# Patient Record
Sex: Male | Born: 1980
Health system: Southern US, Community
[De-identification: ages and names within clinical notes are randomized; demographics above are authoritative.]

## PROBLEM LIST (undated history)

## (undated) DIAGNOSIS — M1990 Unspecified osteoarthritis, unspecified site: Secondary | ICD-10-CM

## (undated) HISTORY — PX: LASIK: SHX215

## (undated) HISTORY — PX: WISDOM TOOTH EXTRACTION: SHX21

## (undated) HISTORY — PX: KNEE SURGERY: SHX244

---

## 2015-03-30 ENCOUNTER — Other Ambulatory Visit: Payer: Self-pay | Admitting: Physician Assistant

## 2015-03-30 NOTE — H&P (Signed)
Cesar Cobb is a new patient to the office.  He presents for evaluation and treatment recommendations for his right knee. I do not have old records but he is a very good historian. His trauma dates back to an injury almost sixteen years ago. He had and arthroscopy and microfracturing by Dr. Latanya Maudlin in [place]. He has had a picture of progressive degenerative arthritis throughout the entire knee over a number of years. This has gotten the best of him within the last year. He is starting to get rest pain and night pain. Increasing deformity.  Marked retropatellar crepitus. There are some mild symptoms on the left but the right has really reached a point he wants to pursue definitive treatment. He went through a series of cortisone injections as well as viscosupplementation. He did get relief for a number of months but nothing long lasting. He saw  Dr. Christain Sacramento in [place] who talked about a unicompartmental replacement in the patellofemoral joint. An MRI was obtained that showed diffuse changes. He was essentially told the only option was a knee replacement. Because of his young age this has been put off as long as possible but he has reached a point of exhausting conservative treatment.  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   Of note, there is not a dramatic history of this much arthritis at a young age in his family.   Past Medical, Family and Social History are reviewed in detail on patient questionnaire and signed. Review of Systems as detailed in HPI; all others reviewed and are negative.     EXAMINATION: Height:     Weight: 175 pounds.   Blood pressure:  145/92.  Pulse:  71  Well-developed, well-nourished 34 year old male in no acute distress.  Alert and oriented x 3.  Examination shows an antalgic gait bilaterally, much worse on the right where he is significantly limping. He has difficulty with any impact loading. There is a negative straight leg raise both sides. Negative log roll both hips. The  right knee has motion of 5-120. Marked patellofemoral crepitus. Stable ligaments. On the left has some moderate patellofemoral crepitus. Motion 0-140. Stable ligaments. He is neurovascularly intact distally.   X-RAYS: Four view standing X-rays show marked changes on the right. Bone on bone patellofemoral joint with tricompartmental changes as well. On the left he has moderate diffuse changes. Nothing is extreme.     DISPOSITION: Endstage arthritis right knee. Primary, generalized. History of trauma in the past. A picture of just progressive tricompartmental changes. He has exhausted conservative therapy. The only significant long term option he has is a total knee replacement on the right. He thoroughly understands that.   I reviewed the procedure, risks, benefits and complications. I anticipate a recovery rehab and final outcome also outlined. Based on his job, he would like to put this off until November. In the interim we will try and repeat one more cortisone injection to give him some relief. We will see him just short of operative intervention.   PROCEDURE: The patient's clinical condition is marked by substantial pain and/or significant functional disability. Other conservative therapy has not provided relief, is contraindicated, or not appropriate. There is a reasonable likelihood that injection will significantly improve the patient's pain and/or functional disability.   After appropriate consent and under sterile technique an intra-articular injection of the right knee 2:6 Depo-Medrol/Marcaine was accomplished atraumatically.  Patient tolerated the procedure well.      Loreta Ave, M.D.

## 2015-04-11 NOTE — Pre-Procedure Instructions (Signed)
Cesar Cobb  04/11/2015      [facility] - [place] - [address] [place] [address] Phone: [phone] Fax: [phone]    Your procedure is scheduled on Wednesday, April 25, 2015  [facility] Admitting at 6:30 A.M.  Call this number if you have problems the morning of surgery:  [phone]   Remember:  Do not eat food or drink liquids after midnight Tuesday, April 24, 2015  Take these medicines the morning of surgery with A SIP OF WATER : None  Stop taking Aspirin, vitamins and herbal medications. Do not take any NSAIDs ie: Ibuprofen, Advil, Naproxen or any medication containing Aspirin; stop Wednesday, April 18, 2015   Do not wear jewelry, make-up or nail polish.  Do not wear lotions, powders, or perfumes.  You may not wear deodorant.  Do not shave 48 hours prior to surgery.  Men may shave face and neck.  Do not bring valuables to the hospital.  [facility] is not responsible for any belongings or valuables.  Contacts, dentures or bridgework may not be worn into surgery.  Leave your suitcase in the car.  After surgery it may be brought to your room.  For patients admitted to the hospital, discharge time will be determined by your treatment team.  Patients discharged the day of surgery will not be allowed to drive home.   Name and phone number of your driver:    Special instructions:  Special Instructions:Special Instructions: [facility] - Preparing for Surgery  Before surgery, you can play an important role.  Because skin is not sterile, your skin needs to be as free of germs as possible.  You can reduce the number of germs on you skin by washing with CHG (chlorahexidine gluconate) soap before surgery.  CHG is an antiseptic cleaner which kills germs and bonds with the skin to continue killing germs even after washing.  Please DO NOT use if you have an allergy to CHG or antibacterial  soaps.  If your skin becomes reddened/irritated stop using the CHG and inform your nurse when you arrive at Short Stay.  Do not shave (including legs and underarms) for at least 48 hours prior to the first CHG shower.  You may shave your face.  Please follow these instructions carefully:   1.  Shower with CHG Soap the night before surgery and the morning of Surgery.  2.  If you choose to wash your hair, wash your hair first as usual with your normal shampoo.  3.  After you shampoo, rinse your hair and body thoroughly to remove the Shampoo.  4.  Use CHG as you would any other liquid soap.  You can apply chg directly  to the skin and wash gently with scrungie or a clean washcloth.  5.  Apply the CHG Soap to your body ONLY FROM THE NECK DOWN.  Do not use on open wounds or open sores.  Avoid contact with your eyes, ears, mouth and genitals (private parts).  Wash genitals (private parts) with your normal soap.  6.  Wash thoroughly, paying special attention to the area where your surgery will be performed.  7.  Thoroughly rinse your body with warm water from the neck down.  8.  DO NOT shower/wash with your normal soap after using and rinsing off the CHG Soap.  9.  Pat yourself dry with a clean towel.  10.  Wear clean pajamas.            11.  Place clean sheets on your bed the night of your first shower and do not sleep with pets.  Day of Surgery  Do not apply any lotions/deodorants the morning of surgery.  Please wear clean clothes to the hospital/surgery center.  Please read over the following fact sheets that you were given. Pain Booklet, Coughing and Deep Breathing, Blood Transfusion Information, Total Joint Packet, MRSA Information and Surgical Site Infection Prevention

## 2015-04-12 ENCOUNTER — Encounter (HOSPITAL_COMMUNITY): Payer: Self-pay

## 2015-04-12 ENCOUNTER — Encounter (HOSPITAL_COMMUNITY)
Admission: RE | Admit: 2015-04-12 | Discharge: 2015-04-12 | Disposition: A | Discharge status: Home / Self Care | Payer: BLUE CROSS/BLUE SHIELD | Source: Ambulatory Visit | Attending: Orthopedic Surgery | Admitting: Orthopedic Surgery

## 2015-04-12 DIAGNOSIS — Z0183 Encounter for blood typing: Secondary | ICD-10-CM | POA: Diagnosis not present

## 2015-04-12 DIAGNOSIS — Z01812 Encounter for preprocedural laboratory examination: Secondary | ICD-10-CM | POA: Diagnosis not present

## 2015-04-12 DIAGNOSIS — M179 Osteoarthritis of knee, unspecified: Secondary | ICD-10-CM | POA: Insufficient documentation

## 2015-04-12 DIAGNOSIS — Z01818 Encounter for other preprocedural examination: Secondary | ICD-10-CM | POA: Insufficient documentation

## 2015-04-12 DIAGNOSIS — R001 Bradycardia, unspecified: Secondary | ICD-10-CM | POA: Diagnosis not present

## 2015-04-12 HISTORY — DX: Unspecified osteoarthritis, unspecified site: M19.90

## 2015-04-12 LAB — CBC WITH DIFFERENTIAL/PLATELET
BASOS ABS: 0 10*3/uL (ref 0.0–0.1)
Basophils Relative: 0 %
EOS PCT: 2 %
Eosinophils Absolute: 0.1 10*3/uL (ref 0.0–0.7)
HEMATOCRIT: 46.4 % (ref 39.0–52.0)
HEMOGLOBIN: 15.8 g/dL (ref 13.0–17.0)
LYMPHS PCT: 32 %
Lymphs Abs: 1.3 10*3/uL (ref 0.7–4.0)
MCH: 29.9 pg (ref 26.0–34.0)
MCHC: 34.1 g/dL (ref 30.0–36.0)
MCV: 87.7 fL (ref 78.0–100.0)
Monocytes Absolute: 0.9 10*3/uL (ref 0.1–1.0)
Monocytes Relative: 23 %
NEUTROS ABS: 1.7 10*3/uL (ref 1.7–7.7)
NEUTROS PCT: 43 %
PLATELETS: 179 10*3/uL (ref 150–400)
RBC: 5.29 MIL/uL (ref 4.22–5.81)
RDW: 12.1 % (ref 11.5–15.5)
WBC: 3.9 10*3/uL — AB (ref 4.0–10.5)

## 2015-04-12 LAB — COMPREHENSIVE METABOLIC PANEL
ALK PHOS: 43 U/L (ref 38–126)
ALT: 19 U/L (ref 17–63)
AST: 21 U/L (ref 15–41)
Albumin: 4.2 g/dL (ref 3.5–5.0)
Anion gap: 6 (ref 5–15)
BUN: 16 mg/dL (ref 6–20)
CHLORIDE: 106 mmol/L (ref 101–111)
CO2: 28 mmol/L (ref 22–32)
CREATININE: 1.06 mg/dL (ref 0.61–1.24)
Calcium: 9.2 mg/dL (ref 8.9–10.3)
GFR calc Af Amer: >60 mL/min (ref >60)
Glucose, Bld: 81 mg/dL (ref 65–99)
Potassium: 4.3 mmol/L (ref 3.5–5.1)
Sodium: 140 mmol/L (ref 135–145)
Total Bilirubin: 0.6 mg/dL (ref 0.3–1.2)
Total Protein: 6.7 g/dL (ref 6.5–8.1)

## 2015-04-12 LAB — APTT: APTT: 33 s (ref 24–37)

## 2015-04-12 LAB — SURGICAL PCR SCREEN
MRSA, PCR: NEGATIVE
Staphylococcus aureus: NEGATIVE

## 2015-04-12 LAB — TYPE AND SCREEN
ABO/RH(D): A POS
Antibody Screen: NEGATIVE

## 2015-04-12 LAB — PROTIME-INR
INR: 1.01 (ref 0.00–1.49)
PROTHROMBIN TIME: 13.5 s (ref 11.6–15.2)

## 2015-04-12 LAB — ABO/RH: ABO/RH(D): A POS

## 2015-04-12 NOTE — Progress Notes (Signed)
Pt denies SOB, chest pain, and being under the care of a cardiologist. Pt denies having a stress test, echo and cardiac cath. Pt denies having an EKG and chest x ray within the last year. Pt currently has no PCP.

## 2015-04-13 LAB — URINE CULTURE: Culture: NO GROWTH

## 2015-04-24 MED ORDER — LACTATED RINGERS IV SOLN
INTRAVENOUS | Status: DC
  Administered 2015-04-25 (×2): via INTRAVENOUS

## 2015-04-24 MED ORDER — TRANEXAMIC ACID 1000 MG/10ML IV SOLN
1000.0000 mg | INTRAVENOUS | Status: AC
  Administered 2015-04-25: 1000 mg via INTRAVENOUS
  Filled 2015-04-24: qty 10

## 2015-04-24 MED ORDER — CEFAZOLIN SODIUM-DEXTROSE 2-3 GM-% IV SOLR
2.0000 g | INTRAVENOUS | Status: AC
  Administered 2015-04-25: 2 g via INTRAVENOUS
  Filled 2015-04-24: qty 50

## 2015-04-24 MED ORDER — CHLORHEXIDINE GLUCONATE 4 % EX LIQD: 60.0000 mL | Freq: Once | CUTANEOUS | Status: DC

## 2015-04-25 ENCOUNTER — Inpatient Hospital Stay (HOSPITAL_COMMUNITY): Payer: BLUE CROSS/BLUE SHIELD | Admitting: Anesthesiology

## 2015-04-25 ENCOUNTER — Inpatient Hospital Stay (HOSPITAL_COMMUNITY)
Admission: RE | Admit: 2015-04-25 | Discharge: 2015-04-26 | DRG: 470 | Disposition: A | Discharge status: Home Health | Payer: BLUE CROSS/BLUE SHIELD | Source: Ambulatory Visit | Attending: Orthopedic Surgery | Admitting: Orthopedic Surgery

## 2015-04-25 ENCOUNTER — Encounter (HOSPITAL_COMMUNITY): Payer: Self-pay | Admitting: *Deleted

## 2015-04-25 ENCOUNTER — Encounter (HOSPITAL_COMMUNITY)
Admission: RE | Disposition: A | Discharge status: Home Health | Payer: Self-pay | Source: Ambulatory Visit | Attending: Orthopedic Surgery

## 2015-04-25 ENCOUNTER — Inpatient Hospital Stay (HOSPITAL_COMMUNITY): Payer: BLUE CROSS/BLUE SHIELD

## 2015-04-25 DIAGNOSIS — Z96651 Presence of right artificial knee joint: Secondary | ICD-10-CM

## 2015-04-25 DIAGNOSIS — M1711 Unilateral primary osteoarthritis, right knee: Secondary | ICD-10-CM | POA: Diagnosis present

## 2015-04-25 DIAGNOSIS — M25561 Pain in right knee: Secondary | ICD-10-CM | POA: Diagnosis present

## 2015-04-25 DIAGNOSIS — M179 Osteoarthritis of knee, unspecified: Secondary | ICD-10-CM | POA: Diagnosis present

## 2015-04-25 DIAGNOSIS — M1710 Unilateral primary osteoarthritis, unspecified knee: Secondary | ICD-10-CM | POA: Diagnosis present

## 2015-04-25 HISTORY — PX: TOTAL KNEE ARTHROPLASTY: SHX125

## 2015-04-25 SURGERY — ARTHROPLASTY, KNEE, TOTAL
Anesthesia: General | Site: Knee | Laterality: Right

## 2015-04-25 MED ORDER — MAGNESIUM CITRATE PO SOLN: 1.0000 | Freq: Once | ORAL | Status: DC | PRN

## 2015-04-25 MED ORDER — DEXAMETHASONE SODIUM PHOSPHATE 10 MG/ML IJ SOLN: 10.0000 mg | Freq: Once | INTRAMUSCULAR | Status: DC

## 2015-04-25 MED ORDER — METHOCARBAMOL 500 MG PO TABS
500.0000 mg | ORAL_TABLET | Freq: Four times a day (QID) | ORAL | Status: DC | PRN
  Administered 2015-04-25 – 2015-04-26 (×4): 500 mg via ORAL
  Filled 2015-04-25 (×4): qty 1

## 2015-04-25 MED ORDER — BUPIVACAINE LIPOSOME 1.3 % IJ SUSP
20.0000 mL | INTRAMUSCULAR | Status: AC
  Administered 2015-04-25: 20 mL
  Filled 2015-04-25: qty 20

## 2015-04-25 MED ORDER — LIDOCAINE HCL (CARDIAC) 20 MG/ML IV SOLN
INTRAVENOUS | Status: DC | PRN
  Administered 2015-04-25: 60 mg via INTRAVENOUS

## 2015-04-25 MED ORDER — DEXAMETHASONE SODIUM PHOSPHATE 10 MG/ML IJ SOLN
INTRAMUSCULAR | Status: DC | PRN
  Administered 2015-04-25: 10 mg via INTRAVENOUS

## 2015-04-25 MED ORDER — FENTANYL CITRATE (PF) 100 MCG/2ML IJ SOLN
INTRAMUSCULAR | Status: DC | PRN
  Administered 2015-04-25 (×5): 50 ug via INTRAVENOUS
  Administered 2015-04-25: 100 ug via INTRAVENOUS

## 2015-04-25 MED ORDER — ACETAMINOPHEN 325 MG PO TABS: 650.0000 mg | ORAL_TABLET | Freq: Four times a day (QID) | ORAL | Status: DC | PRN

## 2015-04-25 MED ORDER — DIPHENHYDRAMINE HCL 12.5 MG/5ML PO ELIX: 12.5000 mg | ORAL_SOLUTION | ORAL | Status: DC | PRN

## 2015-04-25 MED ORDER — POLYETHYLENE GLYCOL 3350 17 G PO PACK: 17.0000 g | PACK | Freq: Every day | ORAL | Status: DC | PRN

## 2015-04-25 MED ORDER — ALUM & MAG HYDROXIDE-SIMETH 200-200-20 MG/5ML PO SUSP: 30.0000 mL | ORAL | Status: DC | PRN

## 2015-04-25 MED ORDER — ONDANSETRON HCL 4 MG/2ML IJ SOLN
INTRAMUSCULAR | Status: DC | PRN
  Administered 2015-04-25: 4 mg via INTRAVENOUS

## 2015-04-25 MED ORDER — OXYCODONE HCL 5 MG/5ML PO SOLN: 5.0000 mg | Freq: Once | ORAL | Status: AC | PRN

## 2015-04-25 MED ORDER — HYDROMORPHONE HCL 1 MG/ML IJ SOLN
0.5000 mg | INTRAMUSCULAR | Status: DC | PRN
  Administered 2015-04-25 (×4): 1 mg via INTRAVENOUS
  Filled 2015-04-25 (×4): qty 1

## 2015-04-25 MED ORDER — BISACODYL 10 MG RE SUPP: 10.0000 mg | Freq: Every day | RECTAL | Status: DC | PRN

## 2015-04-25 MED ORDER — GLYCOPYRROLATE 0.2 MG/ML IJ SOLN
INTRAMUSCULAR | Status: AC
  Filled 2015-04-25: qty 4

## 2015-04-25 MED ORDER — ACETAMINOPHEN 650 MG RE SUPP
650.0000 mg | Freq: Four times a day (QID) | RECTAL | Status: DC | PRN
Start: 2015-04-25 — End: 2015-04-26

## 2015-04-25 MED ORDER — PROPOFOL 10 MG/ML IV BOLUS
INTRAVENOUS | Status: AC
  Filled 2015-04-25: qty 20

## 2015-04-25 MED ORDER — ONDANSETRON HCL 4 MG PO TABS: 4.0000 mg | ORAL_TABLET | Freq: Four times a day (QID) | ORAL | Status: DC | PRN

## 2015-04-25 MED ORDER — CEFAZOLIN SODIUM-DEXTROSE 2-3 GM-% IV SOLR
2.0000 g | Freq: Four times a day (QID) | INTRAVENOUS | Status: AC
  Administered 2015-04-25 – 2015-04-26 (×2): 2 g via INTRAVENOUS
  Filled 2015-04-25 (×4): qty 50

## 2015-04-25 MED ORDER — METOCLOPRAMIDE HCL 5 MG/ML IJ SOLN
5.0000 mg | Freq: Three times a day (TID) | INTRAMUSCULAR | Status: DC | PRN
  Administered 2015-04-25: 10 mg via INTRAVENOUS
  Filled 2015-04-25: qty 2

## 2015-04-25 MED ORDER — POTASSIUM CHLORIDE IN NACL 20-0.9 MEQ/L-% IV SOLN
INTRAVENOUS | Status: DC
  Administered 2015-04-25: 14:00:00 via INTRAVENOUS
  Filled 2015-04-25: qty 1000

## 2015-04-25 MED ORDER — METOCLOPRAMIDE HCL 5 MG PO TABS: 5.0000 mg | ORAL_TABLET | Freq: Three times a day (TID) | ORAL | Status: DC | PRN

## 2015-04-25 MED ORDER — MENTHOL 3 MG MT LOZG: 1.0000 | LOZENGE | OROMUCOSAL | Status: DC | PRN

## 2015-04-25 MED ORDER — GLYCOPYRROLATE 0.2 MG/ML IJ SOLN
INTRAMUSCULAR | Status: AC
Start: 2015-04-25 — End: 2015-04-25
  Filled 2015-04-25: qty 1

## 2015-04-25 MED ORDER — BUPIVACAINE HCL (PF) 0.5 % IJ SOLN
INTRAMUSCULAR | Status: AC
  Filled 2015-04-25: qty 10

## 2015-04-25 MED ORDER — OXYCODONE HCL 5 MG PO TABS
5.0000 mg | ORAL_TABLET | ORAL | Status: DC | PRN
  Administered 2015-04-25 – 2015-04-26 (×6): 10 mg via ORAL
  Filled 2015-04-25 (×6): qty 2

## 2015-04-25 MED ORDER — PROMETHAZINE HCL 25 MG/ML IJ SOLN
12.5000 mg | Freq: Four times a day (QID) | INTRAMUSCULAR | Status: DC | PRN
  Administered 2015-04-25: 12.5 mg via INTRAVENOUS
  Filled 2015-04-25: qty 1

## 2015-04-25 MED ORDER — NEOSTIGMINE METHYLSULFATE 10 MG/10ML IV SOLN
INTRAVENOUS | Status: AC
  Filled 2015-04-25: qty 1

## 2015-04-25 MED ORDER — PROPOFOL 10 MG/ML IV BOLUS
INTRAVENOUS | Status: DC | PRN
  Administered 2015-04-25: 200 mg via INTRAVENOUS

## 2015-04-25 MED ORDER — FENTANYL CITRATE (PF) 250 MCG/5ML IJ SOLN
INTRAMUSCULAR | Status: AC
  Filled 2015-04-25: qty 5

## 2015-04-25 MED ORDER — SODIUM CHLORIDE 0.9 % IR SOLN
Status: DC | PRN
  Administered 2015-04-25 (×2): 1000 mL

## 2015-04-25 MED ORDER — 0.9 % SODIUM CHLORIDE (POUR BTL) OPTIME
TOPICAL | Status: DC | PRN
  Administered 2015-04-25: 1000 mL

## 2015-04-25 MED ORDER — APIXABAN 2.5 MG PO TABS
2.5000 mg | ORAL_TABLET | Freq: Two times a day (BID) | ORAL | Status: DC
  Administered 2015-04-26: 2.5 mg via ORAL
  Filled 2015-04-25: qty 1

## 2015-04-25 MED ORDER — ZOLPIDEM TARTRATE 5 MG PO TABS: 5.0000 mg | ORAL_TABLET | Freq: Every evening | ORAL | Status: DC | PRN

## 2015-04-25 MED ORDER — ROCURONIUM BROMIDE 50 MG/5ML IV SOLN
INTRAVENOUS | Status: AC
Start: 2015-04-25 — End: 2015-04-25
  Filled 2015-04-25: qty 1

## 2015-04-25 MED ORDER — MIDAZOLAM HCL 2 MG/2ML IJ SOLN
INTRAMUSCULAR | Status: AC
  Filled 2015-04-25: qty 4

## 2015-04-25 MED ORDER — MIDAZOLAM HCL 5 MG/5ML IJ SOLN
INTRAMUSCULAR | Status: DC | PRN
Start: 2015-04-25 — End: 2015-04-25
  Administered 2015-04-25: 2 mg via INTRAVENOUS

## 2015-04-25 MED ORDER — CELECOXIB 200 MG PO CAPS
200.0000 mg | ORAL_CAPSULE | Freq: Two times a day (BID) | ORAL | Status: DC
  Administered 2015-04-25 – 2015-04-26 (×2): 200 mg via ORAL
  Filled 2015-04-25 (×2): qty 1

## 2015-04-25 MED ORDER — DOCUSATE SODIUM 100 MG PO CAPS
100.0000 mg | ORAL_CAPSULE | Freq: Two times a day (BID) | ORAL | Status: DC
  Administered 2015-04-25 – 2015-04-26 (×2): 100 mg via ORAL
  Filled 2015-04-25 (×2): qty 1

## 2015-04-25 MED ORDER — ONDANSETRON HCL 4 MG/2ML IJ SOLN
INTRAMUSCULAR | Status: AC
Start: 2015-04-25 — End: 2015-04-25
  Filled 2015-04-25: qty 2

## 2015-04-25 MED ORDER — METOCLOPRAMIDE HCL 5 MG/ML IJ SOLN
10.0000 mg | Freq: Once | INTRAMUSCULAR | Status: DC | PRN
Start: 2015-04-25 — End: 2015-04-25

## 2015-04-25 MED ORDER — HYDROMORPHONE HCL 1 MG/ML IJ SOLN
0.2500 mg | INTRAMUSCULAR | Status: DC | PRN
  Administered 2015-04-25 (×2): 0.5 mg via INTRAVENOUS

## 2015-04-25 MED ORDER — LIDOCAINE HCL (CARDIAC) 20 MG/ML IV SOLN
INTRAVENOUS | Status: AC
  Filled 2015-04-25: qty 5

## 2015-04-25 MED ORDER — ONDANSETRON HCL 4 MG/2ML IJ SOLN
4.0000 mg | Freq: Four times a day (QID) | INTRAMUSCULAR | Status: DC | PRN
  Administered 2015-04-25: 4 mg via INTRAVENOUS
  Filled 2015-04-25: qty 2

## 2015-04-25 MED ORDER — OXYCODONE HCL 5 MG PO TABS
5.0000 mg | ORAL_TABLET | Freq: Once | ORAL | Status: AC | PRN
  Administered 2015-04-25: 5 mg via ORAL

## 2015-04-25 MED ORDER — DEXAMETHASONE SODIUM PHOSPHATE 10 MG/ML IJ SOLN
INTRAMUSCULAR | Status: AC
  Filled 2015-04-25: qty 1

## 2015-04-25 MED ORDER — OXYCODONE HCL 5 MG PO TABS
ORAL_TABLET | ORAL | Status: AC
  Filled 2015-04-25: qty 1

## 2015-04-25 MED ORDER — BUPIVACAINE HCL 0.5 % IJ SOLN
INTRAMUSCULAR | Status: DC | PRN
  Administered 2015-04-25: 10 mL

## 2015-04-25 MED ORDER — METHOCARBAMOL 1000 MG/10ML IJ SOLN
500.0000 mg | Freq: Four times a day (QID) | INTRAVENOUS | Status: DC | PRN
  Filled 2015-04-25: qty 5

## 2015-04-25 MED ORDER — HYDROMORPHONE HCL 1 MG/ML IJ SOLN
INTRAMUSCULAR | Status: AC
  Filled 2015-04-25: qty 1

## 2015-04-25 MED ORDER — PHENOL 1.4 % MT LIQD: 1.0000 | OROMUCOSAL | Status: DC | PRN

## 2015-04-25 SURGICAL SUPPLY — 61 items
BANDAGE ELASTIC 4 VELCRO ST LF (GAUZE/BANDAGES/DRESSINGS) ×3 IMPLANT
BANDAGE ELASTIC 6 VELCRO ST LF (GAUZE/BANDAGES/DRESSINGS) ×3 IMPLANT
BANDAGE ESMARK 6X9 LF (GAUZE/BANDAGES/DRESSINGS) ×1 IMPLANT
BENZOIN TINCTURE PRP APPL 2/3 (GAUZE/BANDAGES/DRESSINGS) ×3 IMPLANT
BLADE SAG 18X100X1.27 (BLADE) ×6 IMPLANT
BNDG ESMARK 6X9 LF (GAUZE/BANDAGES/DRESSINGS) ×3
BOWL SMART MIX CTS (DISPOSABLE) ×3 IMPLANT
CAPT HIP TOTAL 2 ×3 IMPLANT
CEMENT BONE SIMPLEX SPEEDSET (Cement) ×6 IMPLANT
CLOSURE WOUND 1/2 X4 (GAUZE/BANDAGES/DRESSINGS) ×1
COVER SURGICAL LIGHT HANDLE (MISCELLANEOUS) ×3 IMPLANT
CUFF TOURNIQUET SINGLE 34IN LL (TOURNIQUET CUFF) ×3 IMPLANT
DRAPE EXTREMITY T 121X128X90 (DRAPE) ×3 IMPLANT
DRAPE IMP U-DRAPE 54X76 (DRAPES) ×3 IMPLANT
DRAPE PROXIMA HALF (DRAPES) ×3 IMPLANT
DRAPE U-SHAPE 47X51 STRL (DRAPES) ×3 IMPLANT
DRSG PAD ABDOMINAL 8X10 ST (GAUZE/BANDAGES/DRESSINGS) ×3 IMPLANT
DURAPREP 26ML APPLICATOR (WOUND CARE) ×6 IMPLANT
ELECT CAUTERY BLADE 6.4 (BLADE) ×3 IMPLANT
ELECT REM PT RETURN 9FT ADLT (ELECTROSURGICAL) ×3
ELECTRODE REM PT RTRN 9FT ADLT (ELECTROSURGICAL) ×1 IMPLANT
EVACUATOR 1/8 PVC DRAIN (DRAIN) ×3 IMPLANT
FACESHIELD WRAPAROUND (MASK) ×6 IMPLANT
GAUZE SPONGE 4X4 12PLY STRL (GAUZE/BANDAGES/DRESSINGS) ×3 IMPLANT
GLOVE BIOGEL PI IND STRL 7.0 (GLOVE) ×1 IMPLANT
GLOVE BIOGEL PI INDICATOR 7.0 (GLOVE) ×2
GLOVE ORTHO TXT STRL SZ7.5 (GLOVE) ×3 IMPLANT
GLOVE SURG ORTHO 7.0 STRL STRW (GLOVE) ×3 IMPLANT
GOWN STRL REUS W/ TWL LRG LVL3 (GOWN DISPOSABLE) ×2 IMPLANT
GOWN STRL REUS W/ TWL XL LVL3 (GOWN DISPOSABLE) ×1 IMPLANT
GOWN STRL REUS W/TWL LRG LVL3 (GOWN DISPOSABLE) ×4
GOWN STRL REUS W/TWL XL LVL3 (GOWN DISPOSABLE) ×2
HANDPIECE INTERPULSE COAX TIP (DISPOSABLE) ×2
IMMOBILIZER KNEE 22 UNIV (SOFTGOODS) IMPLANT
IMMOBILIZER KNEE 24 THIGH 36 (MISCELLANEOUS) ×1 IMPLANT
IMMOBILIZER KNEE 24 UNIV (MISCELLANEOUS) ×3
KIT BASIN OR (CUSTOM PROCEDURE TRAY) ×3 IMPLANT
KIT ROOM TURNOVER OR (KITS) ×3 IMPLANT
MANIFOLD NEPTUNE II (INSTRUMENTS) ×3 IMPLANT
NEEDLE 18GX1X1/2 (RX/OR ONLY) (NEEDLE) ×3 IMPLANT
NEEDLE HYPO 25GX1X1/2 BEV (NEEDLE) ×3 IMPLANT
NS IRRIG 1000ML POUR BTL (IV SOLUTION) ×3 IMPLANT
PACK TOTAL JOINT (CUSTOM PROCEDURE TRAY) ×3 IMPLANT
PACK UNIVERSAL I (CUSTOM PROCEDURE TRAY) ×3 IMPLANT
PAD ARMBOARD 7.5X6 YLW CONV (MISCELLANEOUS) ×6 IMPLANT
PAD CAST 4YDX4 CTTN HI CHSV (CAST SUPPLIES) ×1 IMPLANT
PADDING CAST COTTON 4X4 STRL (CAST SUPPLIES) ×2
SET HNDPC FAN SPRY TIP SCT (DISPOSABLE) ×1 IMPLANT
STRIP CLOSURE SKIN 1/2X4 (GAUZE/BANDAGES/DRESSINGS) ×2 IMPLANT
SUCTION FRAZIER TIP 10 FR DISP (SUCTIONS) ×3 IMPLANT
SUT MNCRL AB 4-0 PS2 18 (SUTURE) ×3 IMPLANT
SUT VIC AB 0 CT1 27 (SUTURE) ×2
SUT VIC AB 0 CT1 27XBRD ANBCTR (SUTURE) ×1 IMPLANT
SUT VIC AB 1 CTX 36 (SUTURE) ×4
SUT VIC AB 1 CTX36XBRD ANBCTR (SUTURE) ×2 IMPLANT
SUT VIC AB 2-0 CT1 27 (SUTURE) ×4
SUT VIC AB 2-0 CT1 TAPERPNT 27 (SUTURE) ×2 IMPLANT
SYR 50ML LL SCALE MARK (SYRINGE) ×3 IMPLANT
SYR CONTROL 10ML LL (SYRINGE) ×3 IMPLANT
TOWEL OR 17X24 6PK STRL BLUE (TOWEL DISPOSABLE) ×3 IMPLANT
TOWEL OR 17X26 10 PK STRL BLUE (TOWEL DISPOSABLE) ×3 IMPLANT

## 2015-04-25 NOTE — Anesthesia Preprocedure Evaluation (Addendum)
Anesthesia Evaluation  Patient identified by MRN, date of birth, ID band Patient awake    Reviewed: Allergy & Precautions, NPO status , Patient's Chart, lab work & pertinent test results  Airway Mallampati: II  TM Distance: >3 FB Neck ROM: Full    Dental  (+) Teeth Intact, Dental Advisory Given   Pulmonary    breath sounds clear to auscultation       Cardiovascular  Rhythm:Regular Rate:Normal     Neuro/Psych    GI/Hepatic   Endo/Other    Renal/GU      Musculoskeletal   Abdominal   Peds  Hematology   Anesthesia Other Findings   Reproductive/Obstetrics                          Anesthesia Physical Anesthesia Plan  ASA: I  Anesthesia Plan: General   Post-op Pain Management:    Induction: Intravenous  Airway Management Planned: LMA  Additional Equipment:   Intra-op Plan:   Post-operative Plan:   Informed Consent: I have reviewed the patients History and Physical, chart, labs and discussed the procedure including the risks, benefits and alternatives for the proposed anesthesia with the patient or authorized representative who has indicated his/her understanding and acceptance.     Plan Discussed with: CRNA and Anesthesiologist  Anesthesia Plan Comments:         Anesthesia Quick Evaluation

## 2015-04-25 NOTE — Progress Notes (Signed)
Orthopedic Tech Progress Note Patient Details:  Horald Chestnutyler Voorhees 05/14/1981 [id number]  CPM Right Knee CPM Right Knee: On Right Knee Flexion (Degrees): 90 Right Knee Extension (Degrees): 0 Additional Comments: trapeze bar patient helper Viewed order from doctor's order list  Nikki DomCrawford, Karrin Eisenmenger 04/25/2015, 11:55 AM

## 2015-04-25 NOTE — Care Management (Signed)
Utilization review completed. Pier Bosher, RN Case Manager [phone]. 

## 2015-04-25 NOTE — H&P (Signed)
TOTAL KNEE ADMISSION H&P  Patient is being admitted for right total knee arthroplasty.  Subjective:  Chief Complaint:right knee pain.  HPI: Cesar Cobb, 34 y.o. male, has a history of pain and functional disability in the right knee due to trauma and arthritis and has failed non-surgical conservative treatments for greater than 12 weeks to includeNSAID's and/or analgesics, corticosteriod injections, supervised PT with diminished ADL's post treatment and activity modification.  Onset of symptoms was gradual, starting 10 years ago with gradually worsening course since that time. The patient noted prior procedures on the knee to include  arthroscopy on the right knee(s).  Patient currently rates pain in the right knee(s) at 10 out of 10 with activity. Patient has night pain, worsening of pain with activity and weight bearing, pain that interferes with activities of daily living, pain with passive range of motion, crepitus and joint swelling.  Patient has evidence of periarticular osteophytes, joint subluxation and joint space narrowing by imaging studies. This patient has had distal femur fracture. There is no active infection.  There are no active problems to display for this patient.  Past Medical History  Diagnosis Date  . Osteoarthritis     primary localized right knee    Past Surgical History  Procedure Laterality Date  . Knee surgery      arthroscopic right knee  . Lasik    . Wisdom tooth extraction      Prescriptions prior to admission  Medication Sig Dispense Refill Last Dose  . Multiple Vitamins-Minerals (MULTIVITAMIN WITH MINERALS) tablet Take 1 tablet by mouth daily.   Past Month at Unknown time  . naproxen sodium (ALEVE) 220 MG tablet Take 220 mg by mouth at bedtime as needed (knee pain).   Past Month at Unknown time   No Known Allergies  Social History  Substance Use Topics  . Smoking status: Never Smoker   . Smokeless tobacco: Never Used  . Alcohol Use: Yes     Comment:  social    Family History  Problem Relation Age of Onset  . Kidney Stones Father   . Arthritis/Rheumatoid Father   . Prostate cancer Father      ROS  Objective:  Physical Exam  Constitutional: He is oriented to person, place, and time. He appears well-developed and well-nourished.  HENT:  Head: Normocephalic and atraumatic.  Left Ear: External ear normal.  Nose: Nose normal.  Mouth/Throat: Oropharynx is clear and moist.  Eyes: Conjunctivae and EOM are normal. Pupils are equal, round, and reactive to light.  Neck: Normal range of motion. Neck supple.  Cardiovascular: Normal rate.   Respiratory: Effort normal and breath sounds normal.  GI: Soft. Bowel sounds are normal.  Musculoskeletal: He exhibits tenderness.       Right knee: He exhibits decreased range of motion, effusion and deformity. Tenderness found.  Neurological: He is alert and oriented to person, place, and time.  Skin: Skin is warm and dry.  Psychiatric: He has a normal mood and affect.    Vital signs in last 24 hours: Temp:  [98.3 F (36.8 C)] 98.3 F (36.8 C) (11/02 0655) Pulse Rate:  [60] 60 (11/02 0655) Resp:  [18] 18 (11/02 0655) BP: (141)/(93) 141/93 mmHg (11/02 0655) SpO2:  [98 %] 98 % (11/02 0655)  Labs:   There is no weight on file to calculate BMI.   Imaging Review Plain radiographs demonstrate severe degenerative joint disease of the right knee(s). The overall alignment ismild valgus. The bone quality appears to be good  for age and reported activity level.  Assessment/Plan:  End stage arthritis, right knee   The patient history, physical examination, clinical judgment of the provider and imaging studies are consistent with end stage degenerative joint disease of the right knee(s) and total knee arthroplasty is deemed medically necessary. The treatment options including medical management, injection therapy arthroscopy and arthroplasty were discussed at length. The risks and benefits of total  knee arthroplasty were presented and reviewed. The risks due to aseptic loosening, infection, stiffness, patella tracking problems, thromboembolic complications and other imponderables were discussed. The patient acknowledged the explanation, agreed to proceed with the plan and consent was signed. Patient is being admitted for inpatient treatment for surgery, pain control, PT, OT, prophylactic antibiotics, VTE prophylaxis, progressive ambulation and ADL's and discharge planning. The patient is planning to be discharged home with home health services

## 2015-04-25 NOTE — Progress Notes (Signed)
Orthopedic Tech Progress Note Patient Details:  Horald Chestnutyler Eastlick December 24, 1980 [id number] On cpm at 7:10 pm Patient ID: Horald Chestnutyler Violet, male   DOB: December 24, 1980, 34 y.o.   MRN: [id number]   Jennye MoccasinHughes, Keane Martelli Craig 04/25/2015, 7:13 PM

## 2015-04-25 NOTE — Anesthesia Procedure Notes (Signed)
Procedure Name: LMA Insertion Date/Time: 04/25/2015 8:47 AM Performed by: Fanny DanceMULLINS, Daivd Fredericksen L Pre-anesthesia Checklist: Patient identified, Suction available, Emergency Drugs available and Patient being monitored Patient Re-evaluated:Patient Re-evaluated prior to inductionOxygen Delivery Method: Circle system utilized Preoxygenation: Pre-oxygenation with 100% oxygen Intubation Type: IV induction Ventilation: Mask ventilation without difficulty LMA: LMA inserted LMA Size: 5.0 Number of attempts: 1 Placement Confirmation: positive ETCO2,  CO2 detector and breath sounds checked- equal and bilateral Tube secured with: Tape Dental Injury: Teeth and Oropharynx as per pre-operative assessment

## 2015-04-25 NOTE — Transfer of Care (Signed)
Immediate Anesthesia Transfer of Care Note  Patient: Cesar Cobb  Procedure(s) Performed: Procedure(s): TOTAL KNEE ARTHROPLASTY (Right)  Patient Location: PACU  Anesthesia Type:General  Level of Consciousness: sedated  Airway & Oxygen Therapy: Patient Spontanous Breathing and Patient connected to nasal cannula oxygen  Post-op Assessment: Report given to RN and Post -op Vital signs reviewed and stable  Post vital signs: stable  Last Vitals:  Filed Vitals:   04/25/15 0655  BP: 141/93  Pulse: 60  Temp: 36.8 C  Resp: 18    Complications: No apparent anesthesia complications

## 2015-04-25 NOTE — Anesthesia Postprocedure Evaluation (Signed)
  Anesthesia Post-op Note  Patient: Cesar Cobb  Procedure(s) Performed: Procedure(s): TOTAL KNEE ARTHROPLASTY (Right)  Patient Location: PACU  Anesthesia Type:General  Level of Consciousness: awake, alert  and oriented  Airway and Oxygen Therapy: Patient Spontanous Breathing  Post-op Pain: mild  Post-op Assessment: Post-op Vital signs reviewed, Patient's Cardiovascular Status Stable, Respiratory Function Stable, Patent Airway and Pain level controlled              Post-op Vital Signs: stable  Last Vitals:  Filed Vitals:   04/25/15 1233  BP: 139/86  Pulse: 81  Temp: 36.6 C  Resp: 16    Complications: No apparent anesthesia complications

## 2015-04-26 ENCOUNTER — Encounter (HOSPITAL_COMMUNITY): Payer: Self-pay | Admitting: Orthopedic Surgery

## 2015-04-26 LAB — BASIC METABOLIC PANEL
ANION GAP: 13 (ref 5–15)
BUN: 11 mg/dL (ref 6–20)
CALCIUM: 8.4 mg/dL — AB (ref 8.9–10.3)
CO2: 23 mmol/L (ref 22–32)
Chloride: 98 mmol/L — ABNORMAL LOW (ref 101–111)
Creatinine, Ser: 0.99 mg/dL (ref 0.61–1.24)
GFR calc Af Amer: >60 mL/min (ref >60)
GLUCOSE: 89 mg/dL (ref 65–99)
Potassium: 3.9 mmol/L (ref 3.5–5.1)
SODIUM: 134 mmol/L — AB (ref 135–145)

## 2015-04-26 LAB — CBC
HCT: 40.3 % (ref 39.0–52.0)
HEMOGLOBIN: 14.7 g/dL (ref 13.0–17.0)
MCH: 31 pg (ref 26.0–34.0)
MCHC: 36.5 g/dL — ABNORMAL HIGH (ref 30.0–36.0)
MCV: 85 fL (ref 78.0–100.0)
Platelets: 186 10*3/uL (ref 150–400)
RBC: 4.74 MIL/uL (ref 4.22–5.81)
RDW: 11.8 % (ref 11.5–15.5)
WBC: 10.4 10*3/uL (ref 4.0–10.5)

## 2015-04-26 MED ORDER — DOCUSATE SODIUM 100 MG PO CAPS: 100.0000 mg | ORAL_CAPSULE | Freq: Two times a day (BID) | ORAL | Status: AC

## 2015-04-26 MED ORDER — APIXABAN 2.5 MG PO TABS: 2.5000 mg | ORAL_TABLET | Freq: Two times a day (BID) | ORAL | Status: AC

## 2015-04-26 MED ORDER — POLYETHYLENE GLYCOL 3350 17 G PO PACK: PACK | ORAL | Status: AC

## 2015-04-26 MED ORDER — OXYCODONE HCL 5 MG PO TABS: ORAL_TABLET | ORAL | Status: AC

## 2015-04-26 NOTE — Care Management Note (Signed)
Case Management Note  Patient Details  Name: Cesar Cobb MRN: [id number] Date of Birth: 1981/04/07  Subjective/Objective:    34 yr old male s/p right total knee arthroplasty.                Action/Plan:  Case manager spoke with patient and wife at bedside concerning home health and DME needs. Patient was pre[facility], no changes. He will have family support at discharge. No further case management needs identified.   Expected Discharge Date:   04/26/15               Expected Discharge Plan:     In-House Referral:     Discharge planning Services  CM Consult  Post Acute Care Choice:  Home Health, Durable Medical Equipment Choice offered to:     DME Arranged:  3-N-1, Walker rolling, CPM DME Agency:  [employer]  HH Arranged:  PT HH Agency:  [facility]  Status of Service:  Completed, signed off  Medicare Important Message Given:    Date Medicare IM Given:    Medicare IM give by:    Date Additional Medicare IM Given:    Additional Medicare Important Message give by:     If discussed at Long Length of Stay Meetings, dates discussed:    Additional Comments:  [name], [name] [name], RN 04/26/2015, 11:34 AM

## 2015-04-26 NOTE — Evaluation (Signed)
Occupational Therapy Evaluation Patient Details Name: Cesar Cobb MRN: [id number] DOB: 1981/02/17 Today's Date: 04/26/2015    History of Present Illness TOTAL KNEE ARTHROPLASTY (Right)   Clinical Impression   Pt reports he was independent with ADLs and mobility PTA. Currently pt is overall supervision for ADLs and functional mobility. Pt demonstrated impulsivity with transfers and functional mobility; VC for safety and use of RW, pt continued to demonstrate impulsivity. All education completed; pt with no further questions or concerns for OT at this time. Pt plan to d/c home with 24/7 supervision from family. Pt ready to d/c from an OT standpoint; signing off at this time. Thank you for this referral.     Follow Up Recommendations  No OT follow up;Supervision - Intermittent    Equipment Recommendations  3 in 1 bedside comode    Recommendations for Other Services       Precautions / Restrictions Precautions Precautions: Knee;Fall Precaution Booklet Issued: No Restrictions Weight Bearing Restrictions: Yes RLE Weight Bearing: Weight bearing as tolerated      Mobility Bed Mobility Overal bed mobility: Modified Independent                Transfers Overall transfer level: Needs assistance Equipment used: Rolling walker (2 wheeled) Transfers: Sit to/from Stand Sit to Stand: Supervision         General transfer comment: Supervision for safety. Sit to stand from EOB x 1, BSC x 1. Pt impulsive with transfers; standing up before RW was in front of him. VC for safety with RW, pt continued to show impulsivity with functional transfers.    Balance Overall balance assessment: No apparent balance deficits (not formally assessed) (No LOB during functional mobility in room)                                          ADL Overall ADL's : Needs assistance/impaired Eating/Feeding: Set up;Sitting   Grooming: Supervision/safety;Standing       Lower Body  Bathing: Supervison/ safety;Sit to/from stand       Lower Body Dressing: Supervision/safety;Sit to/from stand Lower Body Dressing Details (indicate cue type and reason): Pt reports he was able to dress himself this morning with no assist and no difficulty. Educated on compensatory strategies for LB ADLs; pt verbalized understanding. Toilet Transfer: Supervision/safety;Ambulation;RW;BSC (BSC over toilet) Toilet Transfer Details (indicate cue type and reason): Educated and demonstrated on use of 3 in 1 over toilet and toilet transfer; pt return demonstrated transfer and verbalized understanding of use of 3 in 1 over toilet.  Toileting- Clothing Manipulation and Hygiene: Supervision/safety;Sit to/from stand   Tub/ Shower Transfer: Supervision/safety;Ambulation;Shower Dealerseat;Rolling walker Tub/Shower Transfer Details (indicate cue type and reason): Educated and demonstrated walk in shower transfer technique; pt return demonstrated technique. Provided pt with walk in shower transfer handout.  Functional mobility during ADLs: Supervision/safety;Rolling walker General ADL Comments: Family present during OT eval. Educated on need for close suervision during ADLs and functional mobility, home safety, safety with RW; pt verbalized understanding. Pt impulsive with transfers, not keeping RW in front of him during mobility in bathroom; VC given but pt continued to show impulsivity.     Vision     Perception     Praxis      Pertinent Vitals/Pain Pain Assessment: Faces Faces Pain Scale: Hurts little more Pain Location: R knee Pain Intervention(s): Limited activity within patient's tolerance;Monitored during session;Ice applied  Hand Dominance     Extremity/Trunk Assessment Upper Extremity Assessment Upper Extremity Assessment: Overall WFL for tasks assessed   Lower Extremity Assessment Lower Extremity Assessment: Defer to PT evaluation   Cervical / Trunk Assessment Cervical / Trunk  Assessment: Normal   Communication Communication Communication: No difficulties   Cognition Arousal/Alertness: Awake/alert Behavior During Therapy: Impulsive Overall Cognitive Status: Within Functional Limits for tasks assessed                     General Comments       Exercises       Shoulder Instructions      Home Living Family/patient expects to be discharged to:: Private residence Living Arrangements: Spouse/significant other;Children Available Help at Discharge: Family;Available 24 hours/day Type of Home: House Home Access: Stairs to enter     Home Layout: Two level;Able to live on main level with bedroom/bathroom     Bathroom Shower/Tub: Producer, television/film/video: Standard Bathroom Accessibility: Yes How Accessible: Accessible via walker Home Equipment: None;Hand held shower head;Shower seat - built in          Prior Functioning/Environment Level of Independence: Independent             OT Diagnosis: Acute pain   OT Problem List:     OT Treatment/Interventions:      OT Goals(Current goals can be found in the care plan section) Acute Rehab OT Goals Patient Stated Goal: to go home OT Goal Formulation: With patient  OT Frequency:     Barriers to D/C:            Co-evaluation              End of Session Equipment Utilized During Treatment: Gait belt;Rolling walker CPM Right Knee CPM Right Knee: Off  Activity Tolerance: Patient tolerated treatment well Patient left: in bed;with call bell/phone within reach;with family/visitor present   Time: 4098-1191 OT Time Calculation (min): 13 min Charges:  OT General Charges $OT Visit: 1 Procedure OT Evaluation $Initial OT Evaluation Tier I: 1 Procedure G-Codes:     Gaye Alken M.S., OTR/L Pager: [phone]  04/26/2015, 10:56 AM

## 2015-04-26 NOTE — Evaluation (Signed)
Physical Therapy Evaluation Patient Details Name: Cesar Cobb MRN: [id number] DOB: 19-Feb-1981 Today's Date: 04/26/2015   History of Present Illness  TOTAL KNEE ARTHROPLASTY (Right)  Clinical Impression  Pt is s/p TKA resulting in the deficits listed below (see PT Problem List).  Pt will benefit from skilled PT to increase their independence and safety with mobility to allow discharge to the venue listed below. Patient reports feeling confident with mobility level including ambulation and stairs. Patient declined stairs at this time.     Follow Up Recommendations Home health PT;Supervision for mobility/OOB    Equipment Recommendations  Rolling walker with 5" wheels    Recommendations for Other Services       Precautions / Restrictions Precautions Precautions: Knee Precaution Booklet Issued: Yes (comment) Precaution Comments: HEP provided and reviewed, reviewed no pillows under knee Restrictions Weight Bearing Restrictions: Yes RLE Weight Bearing: Weight bearing as tolerated      Mobility  Bed Mobility             General bed mobility comments: up in chiar up arrival (patient reports feeling confident with getting in/out of bed)  Transfers Overall transfer level: Modified independent Equipment used: Rolling walker (2 wheeled) Transfers: Sit to/from Stand Sit to Stand: Modified independent (Device/Increase time)         General transfer comment: safe technique demonstrated with rw  Ambulation/Gait Ambulation/Gait assistance: Supervision Ambulation Distance (Feet): 50 Feet Assistive device: Rolling walker (2 wheeled) Gait Pattern/deviations: Step-through pattern Gait velocity: decreased   General Gait Details: even strides with ambulation, slight decreased weightbearing through Rt LE  Stairs Stairs:  (patient reports performing this earlier with PA-C, confident)          Wheelchair Mobility    Modified Rankin (Stroke Patients Only)       Balance  Overall balance assessment: Needs assistance Sitting-balance support: No upper extremity supported Sitting balance-Leahy Scale: Normal     Standing balance support: During functional activity Standing balance-Leahy Scale: Fair Standing balance comment: using rw                             Pertinent Vitals/Pain Pain Assessment: 0-10 Pain Score: 3  Faces Pain Scale: Hurts little more Pain Location: Rt knee Pain Descriptors / Indicators: Aching Pain Intervention(s): Limited activity within patient's tolerance;Monitored during session    Home Living Family/patient expects to be discharged to:: Private residence Living Arrangements: Spouse/significant other;Children Available Help at Discharge: Family;Available 24 hours/day Type of Home: House Home Access: Stairs to enter Entrance Stairs-Rails: Right Entrance Stairs-Number of Steps: 2 Home Layout: Two level;Able to live on main level with bedroom/bathroom Home Equipment: None      Prior Function Level of Independence: Independent               Hand Dominance        Extremity/Trunk Assessment   Upper Extremity Assessment: Defer to OT evaluation           Lower Extremity Assessment: RLE deficits/detail RLE Deficits / Details: performing SLR independently      Communication     Cognition Arousal/Alertness: Awake/alert Behavior During Therapy: WFL for tasks assessed/performed Overall Cognitive Status: Within Functional Limits for tasks assessed                      General Comments      Exercises Total Joint Exercises Ankle Circles/Pumps: AROM;Both;15 reps Quad Sets: Strengthening;Right;10 reps Towel Squeeze: Strengthening;Both;5 reps  Short Arc Quad: Strengthening;Right;10 reps Heel Slides: AAROM;Right;10 reps Hip ABduction/ADduction: Strengthening;Right;10 reps Straight Leg Raises: Strengthening;Right;10 reps Long Arc Quad: Strengthening;Right;10 reps Knee Flexion: AROM;Right;20  reps;Seated Goniometric ROM: 95 degrees flexion      Assessment/Plan    PT Assessment Patient needs continued PT services  PT Diagnosis Difficulty walking   PT Problem List Decreased range of motion;Decreased strength;Decreased activity tolerance;Decreased balance;Decreased mobility  PT Treatment Interventions DME instruction;Gait training;Stair training;Functional mobility training;Therapeutic activities;Therapeutic exercise;Balance training;Patient/family education   PT Goals (Current goals can be found in the Care Plan section) Acute Rehab PT Goals Patient Stated Goal: to go home PT Goal Formulation: With patient Time For Goal Achievement: 05/03/15 Potential to Achieve Goals: Good    Frequency 7X/week   Barriers to discharge        Co-evaluation               End of Session Equipment Utilized During Treatment: Gait belt Activity Tolerance: Patient tolerated treatment well Patient left: in chair;with call bell/phone within reach;with family/visitor present;Other (comment) (in bone foam) Nurse Communication: Mobility status;Precautions         Time: 4782-9562 PT Time Calculation (min) (ACUTE ONLY): 29 min   Charges:   PT Evaluation $Initial PT Evaluation Tier I: 1 Procedure PT Treatments $Therapeutic Exercise: 8-22 mins   PT G Codes:        Christiane Ha, PT, CSCS Pager [phone] Office [phone]  04/26/2015, 2:15 PM

## 2015-04-26 NOTE — Op Note (Signed)
NAME:  Cesar OharaKEY, Kinte                   ACCOUNT NO.:  [id number]  MEDICAL RECORD NO.:  [id number]  LOCATION:  [id number]                        FACILITY:  [facility]  PHYSICIAN:  Loreta Aveaniel F. Kaylon Laroche, M.D. DATE OF BIRTH:  [birth year]  DATE OF PROCEDURE:  04/25/2015 DATE OF DISCHARGE:                              OPERATIVE REPORT   PREOPERATIVE DIAGNOSIS:  Right knee end-stage posttraumatic arthritis. Primary localized.  POSTOPERATIVE DIAGNOSES:  Right knee end-stage posttraumatic arthritis. Primary localized.  Marked erosive changes, periarticular spurs.  PROCEDURE:  Right knee modified minimally invasive total knee replacement with Stryker triathlon prosthesis.  Soft tissue balancing. Cemented pegged cruciate retained #6 femoral component.  Cemented #6 tibial component, 9 mm CS insert.  Cemented resurfacing 40 mm patellar component.  SURGEON:  Loreta Aveaniel F. Brunette Lavalle, M.D.  ASSISTANT:  Laural BenesJane B. Su Hiltoberts, [place], present throughout the entire case and necessary for timely completion of procedure.  ANESTHESIA:  General.  BLOOD LOSS:  Minimal.  SPECIMENS:  None.  CULTURES:  None.  COMPLICATIONS:  None.  DRESSINGS:  Soft compressive.  TOURNIQUET TIME:  50 minutes.  DESCRIPTION OF PROCEDURE:  The patient was brought to the operating room, placed on the operating table in a supine position.  After adequate anesthesia had been obtained, tourniquet applied.  Prepped and draped in usual sterile fashion.  Exsanguinated with elevation of Esmarch.  Tourniquet inflated to 350 mmHg.  A longitudinal incision above the patella down the tibial tubercle.  Skin and subcutaneous tissue divided.  Medial arthrotomy, vastus splitting.  Medial capsule released.  Exuberant spurs, loose bodies removed.  Intramedullary guide distal femur.  8 mm resection, 5 degrees of valgus.  Using epicondylar axis, the femur was sized, cut, and fitted for a cruciate retained #6 pegged component retaining PCL.  Proximal tibial resection  with extramedullary guide below his bony defects.  A 3-degree posterior slope cut.  Debris cleared throughout.  Nicely balanced in flexion and extension.  3-degree posterior slope cut completed on the tibia. Patella exposed.  Posterior 11 mm removed.  Drilled, sized, and fitted for a 40 mm component.  Trials put in place.  I was pleased with balancing, stability, alignment, patellar tracking.  Tibia was marked for rotation and hand reamed.  All trials were removed.  Copious irrigation with pulse irrigating device.  Cement prepared, placed on all components, firmly seated.  Polyethylene attached to tibia, knee reduced.  Patella held with a clamp.  Once cement hardened, the knee was irrigated again.  Soft tissue was injected with Exparel.  Arthrotomy closed with Vicryl.  Subcutaneous and subcuticular closure.  Margins were injected with Marcaine.  Sterile compressive dressing applied. Tourniquet deflated and removed.  Anesthesia reversed.  Brought to the recovery room.  Tolerated the surgery well.  No complications.     Loreta Aveaniel F. Mikka Kissner, M.D.     DFM/MEDQ  D:  04/25/2015  T:  04/26/2015  Job:  [id number]

## 2015-04-27 NOTE — Discharge Summary (Signed)
Patient ID: Cesar Cobb MRN: [id number] DOB/AGE: [birth year] 34 y.o.  Admit date: 04/25/2015 Discharge date: 04/26/2015  Admission Diagnoses:  Principal Problem:   DJD (degenerative joint disease) of knee   Discharge Diagnoses:  Same  Past Medical History  Diagnosis Date  . Osteoarthritis     primary localized right knee    Surgeries: Procedure(s): TOTAL KNEE ARTHROPLASTY on 04/25/2015   Consultants:    Discharged Condition: Improved  Hospital Course: Cesar Cobb is an 34 y.o. male who was admitted 04/25/2015 for operative treatment ofDJD (degenerative joint disease) of knee. Patient has severe unremitting pain that affects sleep, daily activities, and work/hobbies. After pre-op clearance the patient was taken to the operating room on 04/25/2015 and underwent  Procedure(s): TOTAL KNEE ARTHROPLASTY.    Patient was given perioperative antibiotics: Anti-infectives    Start     Dose/Rate Route Frequency Ordered Stop   04/25/15 1500  ceFAZolin (ANCEF) IVPB 2 g/50 mL premix     2 g 100 mL/hr over 30 Minutes Intravenous Every 6 hours 04/25/15 1218 04/26/15 0140   04/25/15 0800  ceFAZolin (ANCEF) IVPB 2 g/50 mL premix     2 g 100 mL/hr over 30 Minutes Intravenous To ShortStay Surgical 04/24/15 1418 04/25/15 0852       Patient was given sequential compression devices, early ambulation, and chemoprophylaxis to prevent DVT.  Patient benefited maximally from hospital stay and there were no complications.    Recent vital signs: No data found.    Recent laboratory studies:  Recent Labs  04/26/15 0554  WBC 10.4  HGB 14.7  HCT 40.3  PLT 186  NA 134*  K 3.9  CL 98*  CO2 23  BUN 11  CREATININE 0.99  GLUCOSE 89  CALCIUM 8.4*     Discharge Medications:     Medication List    STOP taking these medications        ALEVE 220 MG tablet  Generic drug:  naproxen sodium      TAKE these medications        apixaban 2.5 MG Tabs tablet  Commonly known as:  ELIQUIS  Take 1  tablet (2.5 mg total) by mouth every 12 (twelve) hours.     docusate sodium 100 MG capsule  Commonly known as:  COLACE  Take 1 capsule (100 mg total) by mouth 2 (two) times daily.     multivitamin with minerals tablet  Take 1 tablet by mouth daily.     oxyCODONE 5 MG immediate release tablet  Commonly known as:  Oxy IR/ROXICODONE  1-2 tablets every 4-6 hrs as needed for pain.   Patient already has script filled for this     polyethylene glycol packet  Commonly known as:  MIRALAX / GLYCOLAX  17grams in 16 oz of water twice a day until bowel movement.  LAXITIVE.  Restart if two days since last bowel movement        Diagnostic Studies: Dg Knee Right Port  04/25/2015  CLINICAL DATA:  Status post right knee replacement. EXAM: PORTABLE RIGHT KNEE - 1-2 VIEW COMPARISON:  None. FINDINGS: The femoral and tibial components are well situated. Expected postoperative findings are seen in the anterior soft tissues. No fracture or dislocation is noted. IMPRESSION: Status post right total knee arthroplasty. Electronically Signed   By: Lupita Raider, M.D.   On: 04/25/2015 11:25    Disposition: 01-Home or Self Care      Discharge Instructions    CPM    Complete by:  As directed   Continuous passive motion machine (CPM):      Use the CPM from 0 to 90 for 6 hours per day.       You may break it up into 2 or 3 sessions per day.      Use CPM for 2 weeks or until you are told to stop.     Call MD / Call 911    Complete by:  As directed   If you experience chest pain or shortness of breath, CALL 911 and be transported to the hospital emergency room.  If you develope a fever above 101 F, pus (white drainage) or increased drainage or redness at the wound, or calf pain, call your surgeon's office.     Change dressing    Complete by:  As directed   Change the gauze dressing daily with sterile 4 x 4 inch gauze and apply TED hose.  DO NOT REMOVE BANDAGE OVER SURGICAL INCISION.  WASH WHOLE LEG INCLUDING  OVER THE WATERPROOF BANDAGE WITH SOAP AND WATER EVERY DAY.     Constipation Prevention    Complete by:  As directed   Drink plenty of fluids.  Prune juice may be helpful.  You may use a stool softener, such as Colace (over the counter) 100 mg twice a day.  Use MiraLax (over the counter) for constipation as needed.     Diet - low sodium heart healthy    Complete by:  As directed      Discharge instructions    Complete by:  As directed   INSTRUCTIONS AFTER JOINT REPLACEMENT   Remove items at home which could result in a fall. This includes throw rugs or furniture in walking pathways ICE to the affected joint every three hours while awake for 30 minutes at a time, for at least the first 3-5 days, and then as needed for pain and swelling.  Continue to use ice for pain and swelling. You may notice swelling that will progress down to the foot and ankle.  This is normal after surgery.  Elevate your leg when you are not up walking on it.   Continue to use the breathing machine you got in the hospital (incentive spirometer) which will help keep your temperature down.  It is common for your temperature to cycle up and down following surgery, especially at night when you are not up moving around and exerting yourself.  The breathing machine keeps your lungs expanded and your temperature down.   DIET:  As you were doing prior to hospitalization, we recommend a well-balanced diet.  DRESSING / WOUND CARE / SHOWERING  Keep the surgical dressing until follow up.  The dressing is water proof, so you can shower without any extra covering.  IF THE DRESSING FALLS OFF or the wound gets wet inside, change the dressing with sterile gauze.  Please use good hand washing techniques before changing the dressing.  Do not use any lotions or creams on the incision until instructed by your surgeon.    ACTIVITY  Increase activity slowly as tolerated, but follow the weight bearing instructions below.   No driving for 6 weeks  or until further direction given by your physician.  You cannot drive while taking narcotics.  No lifting or carrying greater than 10 lbs. until further directed by your surgeon. Avoid periods of inactivity such as sitting longer than an hour when not asleep. This helps prevent blood clots.  You may return to work once you  are authorized by your doctor.     WEIGHT BEARING   Weight bearing as tolerated with assist device (walker, cane, etc) as directed, use it as long as suggested by your surgeon or therapist, typically at least 1-2 weeks.   EXERCISES  Results after joint replacement surgery are often greatly improved when you follow the exercise, range of motion and muscle strengthening exercises prescribed by your doctor. Safety measures are also important to protect the joint from further injury. Any time any of these exercises cause you to have increased pain or swelling, decrease what you are doing until you are comfortable again and then slowly increase them. If you have problems or questions, call your caregiver or physical therapist for advice.   Rehabilitation is important following a joint replacement. After just a few days of immobilization, the muscles of the leg can become weakened and shrink (atrophy).  These exercises are designed to build up the tone and strength of the thigh and leg muscles and to improve motion. Often times heat used for twenty to thirty minutes before working out will loosen up your tissues and help with improving the range of motion but do not use heat for the first two weeks following surgery (sometimes heat can increase post-operative swelling).   These exercises can be done on a training (exercise) mat, on the floor, on a table or on a bed. Use whatever works the best and is most comfortable for you.    Use music or television while you are exercising so that the exercises are a pleasant break in your day. This will make your life better with the exercises  acting as a break in your routine that you can look forward to.   Perform all exercises about fifteen times, three times per day or as directed.  You should exercise both the operative leg and the other leg as well.   Exercises include:   Quad Sets - Tighten up the muscle on the front of the thigh (Quad) and hold for 5-10 seconds.   Straight Leg Raises - With your knee straight (if you were given a brace, keep it on), lift the leg to 60 degrees, hold for 3 seconds, and slowly lower the leg.  Perform this exercise against resistance later as your leg gets stronger.  Leg Slides: Lying on your back, slowly slide your foot toward your buttocks, bending your knee up off the floor (only go as far as is comfortable). Then slowly slide your foot back down until your leg is flat on the floor again.  Angel Wings: Lying on your back spread your legs to the side as far apart as you can without causing discomfort.  Hamstring Strength:  Lying on your back, push your heel against the floor with your leg straight by tightening up the muscles of your buttocks.  Repeat, but this time bend your knee to a comfortable angle, and push your heel against the floor.  You may put a pillow under the heel to make it more comfortable if necessary.   A rehabilitation program following joint replacement surgery can speed recovery and prevent re-injury in the future due to weakened muscles. Contact your doctor or a physical therapist for more information on knee rehabilitation.    CONSTIPATION  Constipation is defined medically as fewer than three stools per week and severe constipation as less than one stool per week.  Even if you have a regular bowel pattern at home, your normal regimen is likely to  be disrupted due to multiple reasons following surgery.  Combination of anesthesia, postoperative narcotics, change in appetite and fluid intake all can affect your bowels.   YOU MUST use at least one of the following options; they  are listed in order of increasing strength to get the job done.  They are all available over the counter, and you may need to use some, POSSIBLY even all of these options:    Drink plenty of fluids (prune juice may be helpful) and high fiber foods Colace 100 mg by mouth twice a day  Senokot for constipation as directed and as needed Dulcolax (bisacodyl), take with full glass of water  Miralax (polyethylene glycol) once or twice a day as needed.  If you have tried all these things and are unable to have a bowel movement in the first 3-4 days after surgery call either your surgeon or your primary doctor.    If you experience loose stools or diarrhea, hold the medications until you stool forms back up.  If your symptoms do not get better within 1 week or if they get worse, check with your doctor.  If you experience "the worst abdominal pain ever" or develop nausea or vomiting, please contact the office immediately for further recommendations for treatment.   ITCHING:  If you experience itching with your medications, try taking only a single pain pill, or even half a pain pill at a time.  You can also use Benadryl over the counter for itching or also to help with sleep.   TED HOSE STOCKINGS:  Use stockings on both legs until for at least 2 weeks or as directed by physician office. They may be removed at night for sleeping.  MEDICATIONS:  See your medication summary on the "After Visit Summary" that nursing will review with you.  You may have some home medications which will be placed on hold until you complete the course of blood thinner medication.  It is important for you to complete the blood thinner medication as prescribed.  PRECAUTIONS:  If you experience chest pain or shortness of breath - call 911 immediately for transfer to the hospital emergency department.   If you develop a fever greater that 101 F, purulent drainage from wound, increased redness or drainage from wound, foul odor from the  wound/dressing, or calf pain - CONTACT YOUR SURGEON.                                                   FOLLOW-UP APPOINTMENTS:  If you do not already have a post-op appointment, please call the office for an appointment to be seen by your surgeon.  Guidelines for how soon to be seen are listed in your "After Visit Summary", but are typically between 1-4 weeks after surgery.  OTHER INSTRUCTIONS:   Knee Replacement:  Do not place pillow under knee, focus on keeping the knee straight while resting. CPM instructions: 0-90 degrees, 2 hours in the morning, 2 hours in the afternoon, and 2 hours in the evening. Place foam block, curve side up under heel at all times except when in CPM or when walking.  DO NOT modify, tear, cut, or change the foam block in any way.  MAKE SURE YOU:  Understand these instructions.  Get help right away if you are not doing well or get worse.  Thank you for letting us be a part of your medical care team.  It is a privilege we respect greatly.  We hope these instructions will help you stay on track for a fast and full recovery!     Do not put a pillow under the knee. Place it under the heel.    Complete by:  As directed   Place gray foam block, curve side up under heel at all times except when in CPM or when walking.  DO NOT modify, tear, cut, or change in any way the gray foam block.     Increase activity slowly as tolerated    Complete by:  As directed      TED hose    Complete by:  As directed   Use stockings (TED hose) for 2 weeks on both leg(s).  You may remove them at night for sleeping.           Follow-up Information    Follow up with [facility].   Why:  Someone from [facility] will contact you concerning start date and time for therapy   Contact information:   [address] [place] [address] [phone]       Follow up with Loreta Ave, MD. Schedule an appointment as soon as possible for a visit in 2 weeks.    Specialty:  Orthopedic Surgery   Contact information:   [address] [place] [address] [phone]        Signed: Pascal Lux 04/27/2015, 10:07 AM

## 2017-06-16 IMAGING — CR DG KNEE 1-2V PORT*R*
2 series · 2 of 2 positions shown · non-contrast
Comparison: None.

CLINICAL DATA: Status post right knee replacement.

EXAM:
PORTABLE RIGHT KNEE - 1-2 VIEW

[AP]
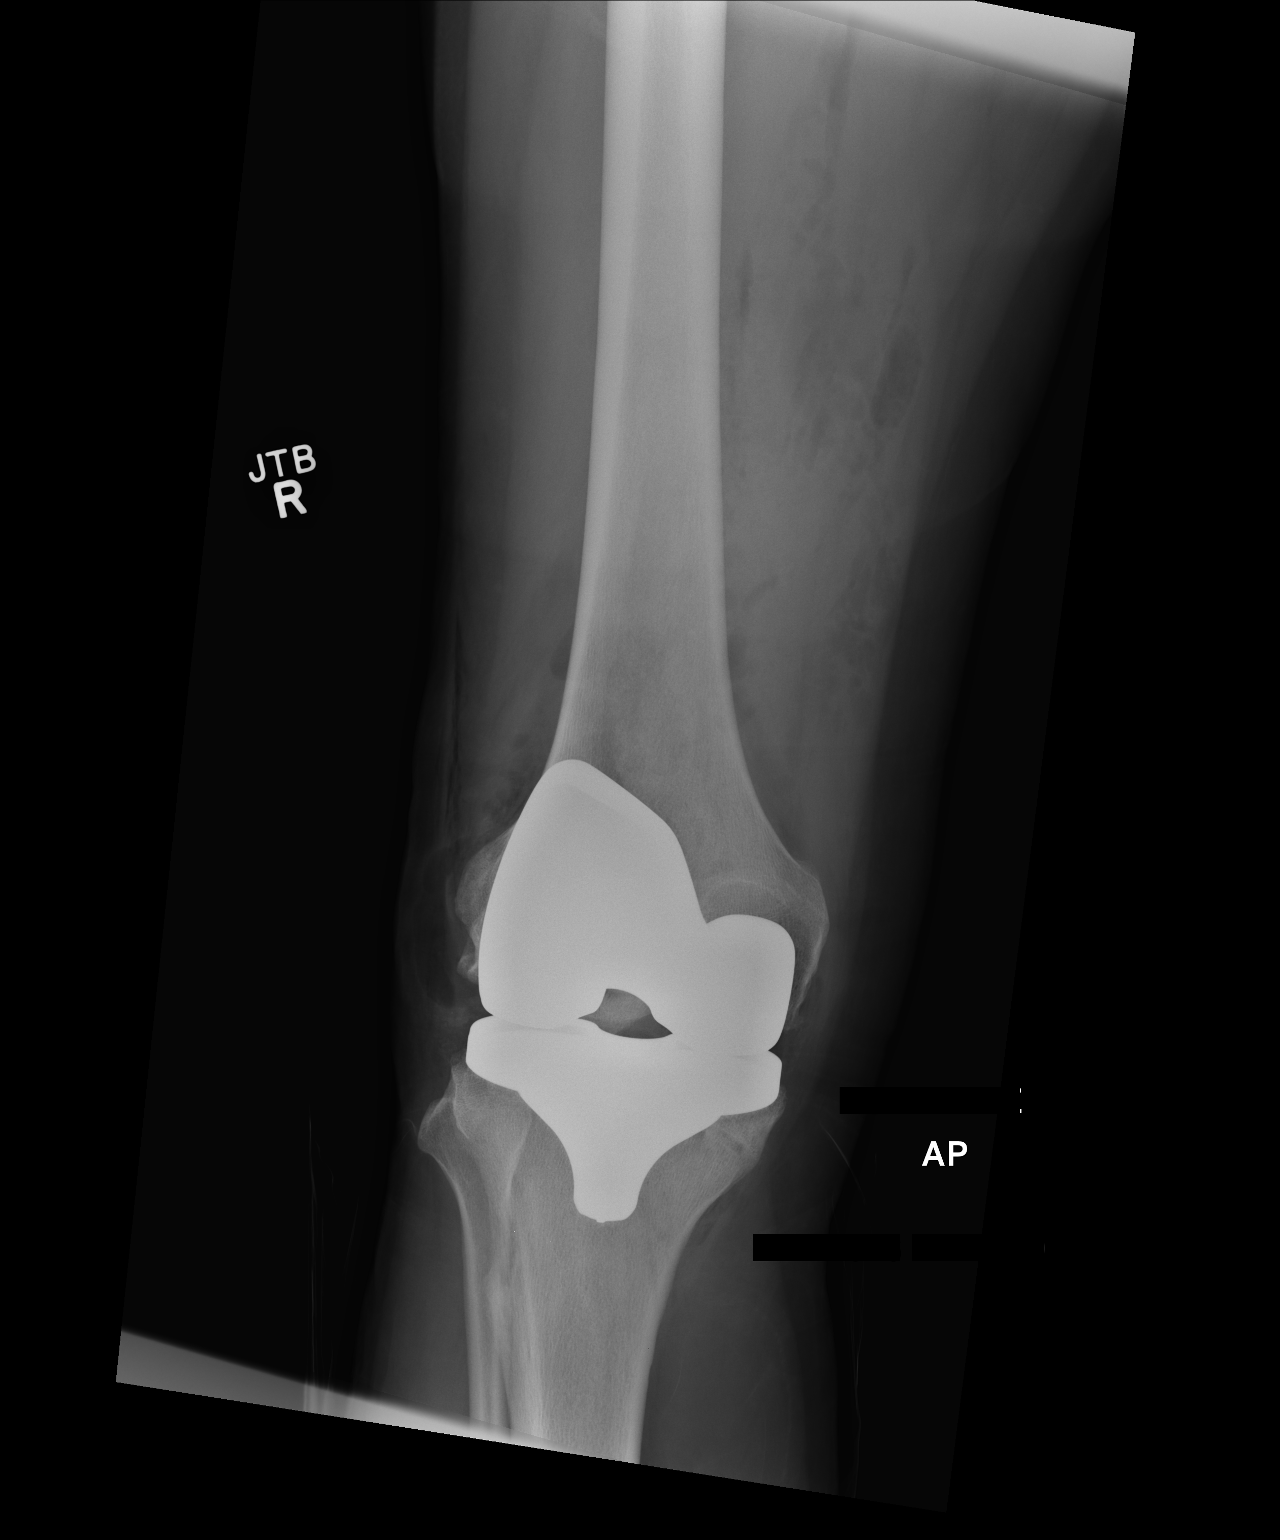

[xtable lateral]
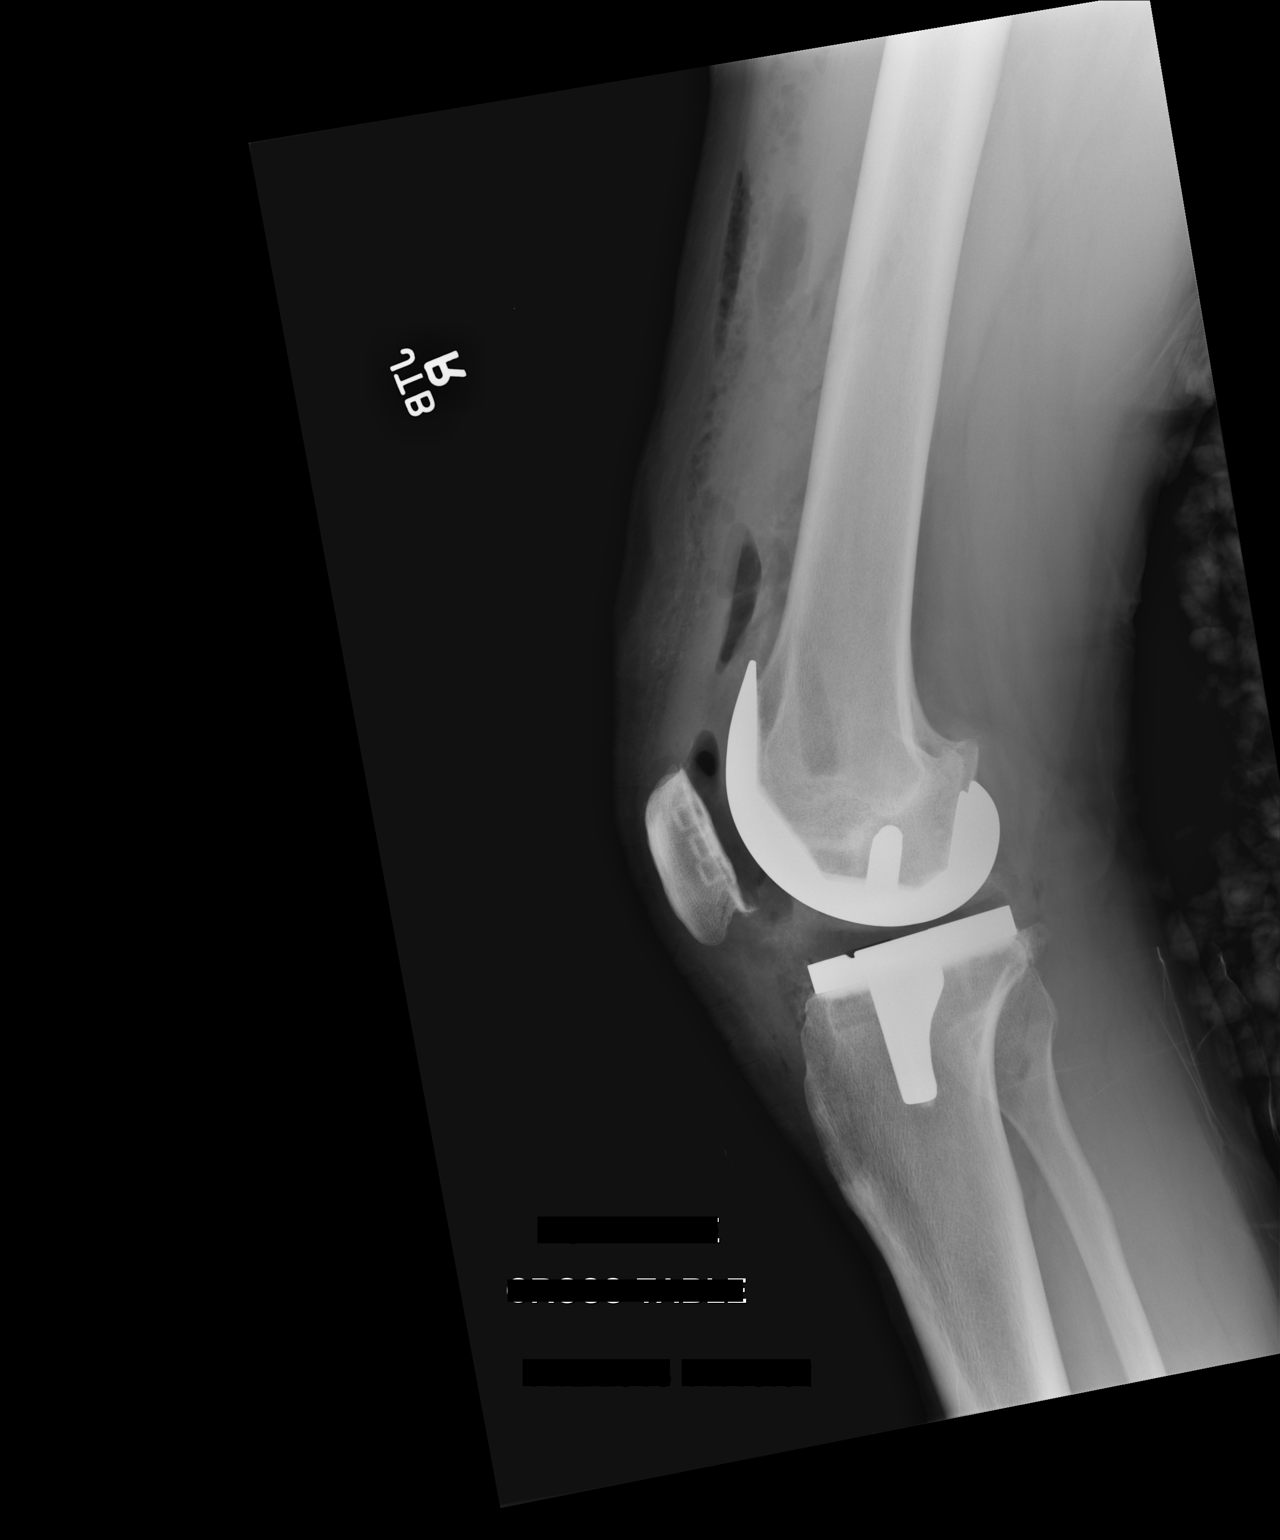

[2 of 2 positions shown; findings below may reference images not displayed]

FINDINGS: The femoral and tibial components are well situated. Expected
postoperative findings are seen in the anterior soft tissues. No
fracture or dislocation is noted.
IMPRESSION: Status post right total knee arthroplasty.

## 2018-07-05 DIAGNOSIS — M9902 Segmental and somatic dysfunction of thoracic region: Secondary | ICD-10-CM | POA: Diagnosis not present

## 2018-07-05 DIAGNOSIS — M47816 Spondylosis without myelopathy or radiculopathy, lumbar region: Secondary | ICD-10-CM | POA: Diagnosis not present

## 2018-07-05 DIAGNOSIS — M9903 Segmental and somatic dysfunction of lumbar region: Secondary | ICD-10-CM | POA: Diagnosis not present

## 2018-07-05 DIAGNOSIS — M9901 Segmental and somatic dysfunction of cervical region: Secondary | ICD-10-CM | POA: Diagnosis not present

## 2018-07-06 DIAGNOSIS — M9903 Segmental and somatic dysfunction of lumbar region: Secondary | ICD-10-CM | POA: Diagnosis not present

## 2018-07-06 DIAGNOSIS — M9902 Segmental and somatic dysfunction of thoracic region: Secondary | ICD-10-CM | POA: Diagnosis not present

## 2018-07-06 DIAGNOSIS — M47816 Spondylosis without myelopathy or radiculopathy, lumbar region: Secondary | ICD-10-CM | POA: Diagnosis not present

## 2018-07-06 DIAGNOSIS — M9901 Segmental and somatic dysfunction of cervical region: Secondary | ICD-10-CM | POA: Diagnosis not present

## 2018-07-07 DIAGNOSIS — M9903 Segmental and somatic dysfunction of lumbar region: Secondary | ICD-10-CM | POA: Diagnosis not present

## 2018-07-07 DIAGNOSIS — M47816 Spondylosis without myelopathy or radiculopathy, lumbar region: Secondary | ICD-10-CM | POA: Diagnosis not present

## 2018-07-07 DIAGNOSIS — M9902 Segmental and somatic dysfunction of thoracic region: Secondary | ICD-10-CM | POA: Diagnosis not present

## 2018-07-07 DIAGNOSIS — M9901 Segmental and somatic dysfunction of cervical region: Secondary | ICD-10-CM | POA: Diagnosis not present

## 2018-07-12 DIAGNOSIS — M9903 Segmental and somatic dysfunction of lumbar region: Secondary | ICD-10-CM | POA: Diagnosis not present

## 2018-07-12 DIAGNOSIS — M9902 Segmental and somatic dysfunction of thoracic region: Secondary | ICD-10-CM | POA: Diagnosis not present

## 2018-07-12 DIAGNOSIS — M9901 Segmental and somatic dysfunction of cervical region: Secondary | ICD-10-CM | POA: Diagnosis not present

## 2018-07-12 DIAGNOSIS — M47816 Spondylosis without myelopathy or radiculopathy, lumbar region: Secondary | ICD-10-CM | POA: Diagnosis not present

## 2018-07-13 DIAGNOSIS — M9903 Segmental and somatic dysfunction of lumbar region: Secondary | ICD-10-CM | POA: Diagnosis not present

## 2018-07-13 DIAGNOSIS — M9902 Segmental and somatic dysfunction of thoracic region: Secondary | ICD-10-CM | POA: Diagnosis not present

## 2018-07-13 DIAGNOSIS — M47816 Spondylosis without myelopathy or radiculopathy, lumbar region: Secondary | ICD-10-CM | POA: Diagnosis not present

## 2018-07-13 DIAGNOSIS — M9901 Segmental and somatic dysfunction of cervical region: Secondary | ICD-10-CM | POA: Diagnosis not present

## 2018-08-17 DIAGNOSIS — M545 Low back pain: Secondary | ICD-10-CM | POA: Diagnosis not present

## 2018-08-17 DIAGNOSIS — M25551 Pain in right hip: Secondary | ICD-10-CM | POA: Diagnosis not present

## 2018-08-23 DIAGNOSIS — M9901 Segmental and somatic dysfunction of cervical region: Secondary | ICD-10-CM | POA: Diagnosis not present

## 2018-08-23 DIAGNOSIS — M9902 Segmental and somatic dysfunction of thoracic region: Secondary | ICD-10-CM | POA: Diagnosis not present

## 2018-08-23 DIAGNOSIS — M9903 Segmental and somatic dysfunction of lumbar region: Secondary | ICD-10-CM | POA: Diagnosis not present

## 2018-08-23 DIAGNOSIS — M47816 Spondylosis without myelopathy or radiculopathy, lumbar region: Secondary | ICD-10-CM | POA: Diagnosis not present

## 2018-08-24 ENCOUNTER — Encounter (INDEPENDENT_AMBULATORY_CARE_PROVIDER_SITE_OTHER): Payer: Self-pay | Admitting: Orthopedic Surgery

## 2018-08-24 ENCOUNTER — Ambulatory Visit (INDEPENDENT_AMBULATORY_CARE_PROVIDER_SITE_OTHER): Payer: BLUE CROSS/BLUE SHIELD | Admitting: Family Medicine

## 2018-08-24 DIAGNOSIS — G8929 Other chronic pain: Secondary | ICD-10-CM

## 2018-08-24 DIAGNOSIS — M47816 Spondylosis without myelopathy or radiculopathy, lumbar region: Secondary | ICD-10-CM | POA: Diagnosis not present

## 2018-08-24 DIAGNOSIS — M9901 Segmental and somatic dysfunction of cervical region: Secondary | ICD-10-CM | POA: Diagnosis not present

## 2018-08-24 DIAGNOSIS — M5441 Lumbago with sciatica, right side: Secondary | ICD-10-CM | POA: Diagnosis not present

## 2018-08-24 DIAGNOSIS — M9902 Segmental and somatic dysfunction of thoracic region: Secondary | ICD-10-CM | POA: Diagnosis not present

## 2018-08-24 DIAGNOSIS — M9903 Segmental and somatic dysfunction of lumbar region: Secondary | ICD-10-CM | POA: Diagnosis not present

## 2018-08-24 MED ORDER — TIZANIDINE HCL 2 MG PO TABS: 2.0000 mg | ORAL_TABLET | Freq: Every evening | ORAL | 1 refills | Status: AC | PRN

## 2018-08-24 MED ORDER — NABUMETONE 750 MG PO TABS: 750.0000 mg | ORAL_TABLET | Freq: Two times a day (BID) | ORAL | 6 refills | Status: AC | PRN

## 2018-08-24 NOTE — Progress Notes (Signed)
   Office Visit Note   Patient: Cesar Cobb           Date of Birth: [birth year]           MRN: [id number] Visit Date: 08/24/2018 Requested by: No referring provider defined for this encounter. PCP: Patient, No Pcp Per  Subjective: Chief Complaint  Patient presents with  . Lower Back - Pain    HPI: He is here with low back and right leg pain.  Symptoms started a couple weeks ago with no injury, first he felt pain in the hamstring followed by low back pain and then pain extending down to the foot.  He gets numbness and tingling with a cold sensation in his foot intermittently.  He recently started chiropractic per Dr. Mardene Celeste and it seems to be helping a little bit.  Denies any bowel or bladder dysfunction.  He went to [facility] urgent care where they took x-rays of his lumbar spine and hips which were basically unrevealing.  I reviewed the films today.  He is never had problems with these areas before but he has had right knee replacement in 2016 after developing end-stage arthritis related to a knee injury when he was 17.  He has done well from that standpoint.              ROS: Otherwise noncontributory  Objective: Vital Signs: There were no vitals taken for this visit.  Physical Exam:  Low back: No visible rash.  No tenderness lumbar spine.  Straight leg raise equivocal, no pain with internal hip rotation.  Slight tenderness in the right sciatic notch.  5/5 hip flexion, knee extension, ankle eversion and plantarflexion strength but he has weakness with ankle dorsiflexion, 4/5.  Normal strength on the left.  Imaging: None today.  Assessment & Plan: 1.  Right-sided low back and radicular pain with ankle dorsiflexion weakness, suspicious for disc protrusion - Muscle relaxant and anti-inflammatory as needed.  Continue with chiropractic.  If symptoms do not improve in the next week or 2, he will call me to order MRI scan.     Procedures: No procedures performed  No notes on file      PMFS History: Patient Active Problem List   Diagnosis Date Noted  . DJD (degenerative joint disease) of knee 04/25/2015   Past Medical History:  Diagnosis Date  . Osteoarthritis    primary localized right knee    Family History  Problem Relation Age of Onset  . Kidney Stones Father   . Arthritis/Rheumatoid Father   . Prostate cancer Father     Past Surgical History:  Procedure Laterality Date  . KNEE SURGERY     arthroscopic right knee  . LASIK    . TOTAL KNEE ARTHROPLASTY Right 04/25/2015   Procedure: TOTAL KNEE ARTHROPLASTY;  Surgeon: Loreta Ave, MD;  Location: [facility] OR;  Service: Orthopedics;  Laterality: Right;  . WISDOM TOOTH EXTRACTION     Social History   Occupational History  . Not on file  Tobacco Use  . Smoking status: Never Smoker  . Smokeless tobacco: Never Used  Substance and Sexual Activity  . Alcohol use: Yes    Comment: social  . Drug use: No  . Sexual activity: Not on file

## 2018-08-25 DIAGNOSIS — M9902 Segmental and somatic dysfunction of thoracic region: Secondary | ICD-10-CM | POA: Diagnosis not present

## 2018-08-25 DIAGNOSIS — M47816 Spondylosis without myelopathy or radiculopathy, lumbar region: Secondary | ICD-10-CM | POA: Diagnosis not present

## 2018-08-25 DIAGNOSIS — M9901 Segmental and somatic dysfunction of cervical region: Secondary | ICD-10-CM | POA: Diagnosis not present

## 2018-08-25 DIAGNOSIS — M9903 Segmental and somatic dysfunction of lumbar region: Secondary | ICD-10-CM | POA: Diagnosis not present

## 2018-08-30 DIAGNOSIS — M47816 Spondylosis without myelopathy or radiculopathy, lumbar region: Secondary | ICD-10-CM | POA: Diagnosis not present

## 2018-08-30 DIAGNOSIS — M9901 Segmental and somatic dysfunction of cervical region: Secondary | ICD-10-CM | POA: Diagnosis not present

## 2018-08-30 DIAGNOSIS — M9903 Segmental and somatic dysfunction of lumbar region: Secondary | ICD-10-CM | POA: Diagnosis not present

## 2018-08-30 DIAGNOSIS — M9902 Segmental and somatic dysfunction of thoracic region: Secondary | ICD-10-CM | POA: Diagnosis not present

## 2018-08-31 DIAGNOSIS — M9903 Segmental and somatic dysfunction of lumbar region: Secondary | ICD-10-CM | POA: Diagnosis not present

## 2018-08-31 DIAGNOSIS — M9901 Segmental and somatic dysfunction of cervical region: Secondary | ICD-10-CM | POA: Diagnosis not present

## 2018-08-31 DIAGNOSIS — M47816 Spondylosis without myelopathy or radiculopathy, lumbar region: Secondary | ICD-10-CM | POA: Diagnosis not present

## 2018-08-31 DIAGNOSIS — M9902 Segmental and somatic dysfunction of thoracic region: Secondary | ICD-10-CM | POA: Diagnosis not present

## 2018-09-01 DIAGNOSIS — M9903 Segmental and somatic dysfunction of lumbar region: Secondary | ICD-10-CM | POA: Diagnosis not present

## 2018-09-01 DIAGNOSIS — M9901 Segmental and somatic dysfunction of cervical region: Secondary | ICD-10-CM | POA: Diagnosis not present

## 2018-09-01 DIAGNOSIS — M47816 Spondylosis without myelopathy or radiculopathy, lumbar region: Secondary | ICD-10-CM | POA: Diagnosis not present

## 2018-09-01 DIAGNOSIS — M9902 Segmental and somatic dysfunction of thoracic region: Secondary | ICD-10-CM | POA: Diagnosis not present

## 2018-09-07 DIAGNOSIS — M9903 Segmental and somatic dysfunction of lumbar region: Secondary | ICD-10-CM | POA: Diagnosis not present

## 2018-09-07 DIAGNOSIS — M47816 Spondylosis without myelopathy or radiculopathy, lumbar region: Secondary | ICD-10-CM | POA: Diagnosis not present

## 2018-09-07 DIAGNOSIS — M9902 Segmental and somatic dysfunction of thoracic region: Secondary | ICD-10-CM | POA: Diagnosis not present

## 2018-09-07 DIAGNOSIS — M9901 Segmental and somatic dysfunction of cervical region: Secondary | ICD-10-CM | POA: Diagnosis not present

## 2018-09-08 DIAGNOSIS — M47816 Spondylosis without myelopathy or radiculopathy, lumbar region: Secondary | ICD-10-CM | POA: Diagnosis not present

## 2018-09-08 DIAGNOSIS — M9903 Segmental and somatic dysfunction of lumbar region: Secondary | ICD-10-CM | POA: Diagnosis not present

## 2018-09-08 DIAGNOSIS — M9902 Segmental and somatic dysfunction of thoracic region: Secondary | ICD-10-CM | POA: Diagnosis not present

## 2018-09-08 DIAGNOSIS — M9901 Segmental and somatic dysfunction of cervical region: Secondary | ICD-10-CM | POA: Diagnosis not present

## 2018-09-13 DIAGNOSIS — M9902 Segmental and somatic dysfunction of thoracic region: Secondary | ICD-10-CM | POA: Diagnosis not present

## 2018-09-13 DIAGNOSIS — M47816 Spondylosis without myelopathy or radiculopathy, lumbar region: Secondary | ICD-10-CM | POA: Diagnosis not present

## 2018-09-13 DIAGNOSIS — M9903 Segmental and somatic dysfunction of lumbar region: Secondary | ICD-10-CM | POA: Diagnosis not present

## 2018-09-13 DIAGNOSIS — M9901 Segmental and somatic dysfunction of cervical region: Secondary | ICD-10-CM | POA: Diagnosis not present

## 2018-09-15 DIAGNOSIS — M47816 Spondylosis without myelopathy or radiculopathy, lumbar region: Secondary | ICD-10-CM | POA: Diagnosis not present

## 2018-09-15 DIAGNOSIS — M9902 Segmental and somatic dysfunction of thoracic region: Secondary | ICD-10-CM | POA: Diagnosis not present

## 2018-09-15 DIAGNOSIS — M9903 Segmental and somatic dysfunction of lumbar region: Secondary | ICD-10-CM | POA: Diagnosis not present

## 2018-09-15 DIAGNOSIS — M9901 Segmental and somatic dysfunction of cervical region: Secondary | ICD-10-CM | POA: Diagnosis not present

## 2018-09-22 DIAGNOSIS — M9902 Segmental and somatic dysfunction of thoracic region: Secondary | ICD-10-CM | POA: Diagnosis not present

## 2018-09-22 DIAGNOSIS — M9901 Segmental and somatic dysfunction of cervical region: Secondary | ICD-10-CM | POA: Diagnosis not present

## 2018-09-22 DIAGNOSIS — M47816 Spondylosis without myelopathy or radiculopathy, lumbar region: Secondary | ICD-10-CM | POA: Diagnosis not present

## 2018-09-22 DIAGNOSIS — M9903 Segmental and somatic dysfunction of lumbar region: Secondary | ICD-10-CM | POA: Diagnosis not present

## 2018-10-26 DIAGNOSIS — R142 Eructation: Secondary | ICD-10-CM | POA: Diagnosis not present

## 2019-02-17 DIAGNOSIS — Z23 Encounter for immunization: Secondary | ICD-10-CM | POA: Diagnosis not present

## 2019-02-17 DIAGNOSIS — Z0000 Encounter for general adult medical examination without abnormal findings: Secondary | ICD-10-CM | POA: Diagnosis not present

## 2019-02-18 DIAGNOSIS — E7849 Other hyperlipidemia: Secondary | ICD-10-CM | POA: Diagnosis not present

## 2019-02-24 DIAGNOSIS — Z1331 Encounter for screening for depression: Secondary | ICD-10-CM | POA: Diagnosis not present

## 2019-02-24 DIAGNOSIS — K219 Gastro-esophageal reflux disease without esophagitis: Secondary | ICD-10-CM | POA: Diagnosis not present

## 2019-02-24 DIAGNOSIS — R142 Eructation: Secondary | ICD-10-CM | POA: Diagnosis not present

## 2019-02-24 DIAGNOSIS — Z0000 Encounter for general adult medical examination without abnormal findings: Secondary | ICD-10-CM | POA: Diagnosis not present

## 2019-02-24 DIAGNOSIS — E785 Hyperlipidemia, unspecified: Secondary | ICD-10-CM | POA: Diagnosis not present

## 2019-08-28 ENCOUNTER — Ambulatory Visit: Payer: Self-pay

## 2019-09-12 ENCOUNTER — Ambulatory Visit: Payer: Self-pay

## 2019-09-12 ENCOUNTER — Ambulatory Visit: Payer: Self-pay | Attending: Internal Medicine

## 2019-09-12 DIAGNOSIS — Z23 Encounter for immunization: Secondary | ICD-10-CM

## 2019-09-12 NOTE — Progress Notes (Signed)
   Covid-19 Vaccination Clinic  Name:  Cesar Cobb    MRN: [id number] DOB: 09/28/1980  09/12/2019  Cesar Cobb was observed post Covid-19 immunization for 15 minutes without incident. He was provided with Vaccine Information Sheet and instruction to access the V-Safe system.   Cesar Cobb was instructed to call 911 with any severe reactions post vaccine: Marland Kitchen Difficulty breathing  . Swelling of face and throat  . A fast heartbeat  . A bad rash all over body  . Dizziness and weakness   Immunizations Administered    Name Date Dose VIS Date Route   Pfizer COVID-19 Vaccine 09/12/2019 11:01 AM 0.3 mL 06/03/2019 Intramuscular   Manufacturer: [employer], [employer]   Lot: [id number]   NDC: [address]-4

## 2019-10-05 ENCOUNTER — Ambulatory Visit: Payer: Self-pay | Attending: Internal Medicine

## 2019-10-05 DIAGNOSIS — Z23 Encounter for immunization: Secondary | ICD-10-CM

## 2019-10-05 NOTE — Progress Notes (Signed)
   Covid-19 Vaccination Clinic  Name:  Cesar Cobb    MRN: [id number] DOB: 13-Apr-1981  10/05/2019  Mr. Cesar Cobb was observed post Covid-19 immunization for 15 minutes without incident. He was provided with Vaccine Information Sheet and instruction to access the V-Safe system.   Mr. Cesar Cobb was instructed to call 911 with any severe reactions post vaccine: Marland Kitchen Difficulty breathing  . Swelling of face and throat  . A fast heartbeat  . A bad rash all over body  . Dizziness and weakness   Immunizations Administered    Name Date Dose VIS Date Route   Pfizer COVID-19 Vaccine 10/05/2019 11:46 AM 0.3 mL 06/03/2019 Intramuscular   Manufacturer: [employer], [employer]   Lot: [id number]   NDC: [address]-8

## 2020-02-15 DIAGNOSIS — Z20822 Contact with and (suspected) exposure to covid-19: Secondary | ICD-10-CM | POA: Diagnosis not present

## 2020-03-23 DIAGNOSIS — Z20822 Contact with and (suspected) exposure to covid-19: Secondary | ICD-10-CM | POA: Diagnosis not present

## 2021-03-05 DIAGNOSIS — M7022 Olecranon bursitis, left elbow: Secondary | ICD-10-CM | POA: Diagnosis not present

## 2021-03-05 DIAGNOSIS — L03114 Cellulitis of left upper limb: Secondary | ICD-10-CM | POA: Diagnosis not present

## 2022-10-01 DIAGNOSIS — K219 Gastro-esophageal reflux disease without esophagitis: Secondary | ICD-10-CM | POA: Diagnosis not present

## 2022-10-01 DIAGNOSIS — E785 Hyperlipidemia, unspecified: Secondary | ICD-10-CM | POA: Diagnosis not present

## 2022-10-01 DIAGNOSIS — Z125 Encounter for screening for malignant neoplasm of prostate: Secondary | ICD-10-CM | POA: Diagnosis not present

## 2022-10-08 DIAGNOSIS — Z1331 Encounter for screening for depression: Secondary | ICD-10-CM | POA: Diagnosis not present

## 2022-10-08 DIAGNOSIS — E785 Hyperlipidemia, unspecified: Secondary | ICD-10-CM | POA: Diagnosis not present

## 2022-10-08 DIAGNOSIS — Z23 Encounter for immunization: Secondary | ICD-10-CM | POA: Diagnosis not present

## 2022-10-08 DIAGNOSIS — Z0000 Encounter for general adult medical examination without abnormal findings: Secondary | ICD-10-CM | POA: Diagnosis not present

## 2022-10-08 DIAGNOSIS — R5383 Other fatigue: Secondary | ICD-10-CM | POA: Diagnosis not present

## 2022-10-08 DIAGNOSIS — R82998 Other abnormal findings in urine: Secondary | ICD-10-CM | POA: Diagnosis not present

## 2022-10-08 DIAGNOSIS — R7401 Elevation of levels of liver transaminase levels: Secondary | ICD-10-CM | POA: Diagnosis not present

## 2022-10-08 DIAGNOSIS — Z1339 Encounter for screening examination for other mental health and behavioral disorders: Secondary | ICD-10-CM | POA: Diagnosis not present

## 2022-10-08 DIAGNOSIS — K219 Gastro-esophageal reflux disease without esophagitis: Secondary | ICD-10-CM | POA: Diagnosis not present

## 2023-09-14 DIAGNOSIS — R300 Dysuria: Secondary | ICD-10-CM | POA: Diagnosis not present

## 2023-09-14 DIAGNOSIS — Z202 Contact with and (suspected) exposure to infections with a predominantly sexual mode of transmission: Secondary | ICD-10-CM | POA: Diagnosis not present

## 2023-10-15 DIAGNOSIS — Z125 Encounter for screening for malignant neoplasm of prostate: Secondary | ICD-10-CM | POA: Diagnosis not present

## 2023-10-15 DIAGNOSIS — E785 Hyperlipidemia, unspecified: Secondary | ICD-10-CM | POA: Diagnosis not present

## 2023-10-22 DIAGNOSIS — R82998 Other abnormal findings in urine: Secondary | ICD-10-CM | POA: Diagnosis not present

## 2023-10-22 DIAGNOSIS — Z1339 Encounter for screening examination for other mental health and behavioral disorders: Secondary | ICD-10-CM | POA: Diagnosis not present

## 2023-10-22 DIAGNOSIS — Z0000 Encounter for general adult medical examination without abnormal findings: Secondary | ICD-10-CM | POA: Diagnosis not present

## 2023-10-22 DIAGNOSIS — Z1331 Encounter for screening for depression: Secondary | ICD-10-CM | POA: Diagnosis not present

## 2023-12-28 DIAGNOSIS — M5441 Lumbago with sciatica, right side: Secondary | ICD-10-CM | POA: Diagnosis not present

## 2024-01-11 DIAGNOSIS — M545 Low back pain, unspecified: Secondary | ICD-10-CM | POA: Diagnosis not present

## 2024-01-13 DIAGNOSIS — M533 Sacrococcygeal disorders, not elsewhere classified: Secondary | ICD-10-CM | POA: Diagnosis not present

## 2024-01-18 DIAGNOSIS — M5416 Radiculopathy, lumbar region: Secondary | ICD-10-CM | POA: Diagnosis not present

## 2024-01-18 DIAGNOSIS — M545 Low back pain, unspecified: Secondary | ICD-10-CM | POA: Diagnosis not present

## 2024-01-19 ENCOUNTER — Other Ambulatory Visit: Payer: Self-pay | Admitting: Physician Assistant

## 2024-01-19 DIAGNOSIS — M545 Low back pain, unspecified: Secondary | ICD-10-CM

## 2024-01-22 DIAGNOSIS — M5416 Radiculopathy, lumbar region: Secondary | ICD-10-CM | POA: Diagnosis not present

## 2024-01-22 DIAGNOSIS — M545 Low back pain, unspecified: Secondary | ICD-10-CM | POA: Diagnosis not present

## 2024-01-27 DIAGNOSIS — M545 Low back pain, unspecified: Secondary | ICD-10-CM | POA: Diagnosis not present

## 2024-01-27 DIAGNOSIS — M5416 Radiculopathy, lumbar region: Secondary | ICD-10-CM | POA: Diagnosis not present

## 2024-01-29 ENCOUNTER — Ambulatory Visit
Admission: RE | Admit: 2024-01-29 | Discharge: 2024-01-29 | Disposition: A | Discharge status: Home / Self Care | Payer: Self-pay | Source: Ambulatory Visit | Attending: Physician Assistant | Admitting: Physician Assistant

## 2024-01-29 DIAGNOSIS — R29898 Other symptoms and signs involving the musculoskeletal system: Secondary | ICD-10-CM | POA: Diagnosis not present

## 2024-01-29 DIAGNOSIS — M545 Low back pain, unspecified: Secondary | ICD-10-CM

## 2024-02-01 DIAGNOSIS — M5416 Radiculopathy, lumbar region: Secondary | ICD-10-CM | POA: Diagnosis not present

## 2024-02-01 DIAGNOSIS — M545 Low back pain, unspecified: Secondary | ICD-10-CM | POA: Diagnosis not present

## 2024-02-03 DIAGNOSIS — M51362 Other intervertebral disc degeneration, lumbar region with discogenic back pain and lower extremity pain: Secondary | ICD-10-CM | POA: Diagnosis not present

## 2024-02-05 DIAGNOSIS — M51362 Other intervertebral disc degeneration, lumbar region with discogenic back pain and lower extremity pain: Secondary | ICD-10-CM | POA: Diagnosis not present

## 2024-02-05 DIAGNOSIS — M545 Low back pain, unspecified: Secondary | ICD-10-CM | POA: Diagnosis not present

## 2024-02-08 DIAGNOSIS — M5416 Radiculopathy, lumbar region: Secondary | ICD-10-CM | POA: Diagnosis not present

## 2024-02-08 DIAGNOSIS — M545 Low back pain, unspecified: Secondary | ICD-10-CM | POA: Diagnosis not present

## 2024-02-09 DIAGNOSIS — M5416 Radiculopathy, lumbar region: Secondary | ICD-10-CM | POA: Diagnosis not present

## 2024-02-11 DIAGNOSIS — M5416 Radiculopathy, lumbar region: Secondary | ICD-10-CM | POA: Diagnosis not present

## 2024-02-17 DIAGNOSIS — M51362 Other intervertebral disc degeneration, lumbar region with discogenic back pain and lower extremity pain: Secondary | ICD-10-CM | POA: Diagnosis not present

## 2024-02-23 DIAGNOSIS — M5416 Radiculopathy, lumbar region: Secondary | ICD-10-CM | POA: Diagnosis not present

## 2024-02-23 DIAGNOSIS — M51362 Other intervertebral disc degeneration, lumbar region with discogenic back pain and lower extremity pain: Secondary | ICD-10-CM | POA: Diagnosis not present

## 2024-02-26 DIAGNOSIS — M51362 Other intervertebral disc degeneration, lumbar region with discogenic back pain and lower extremity pain: Secondary | ICD-10-CM | POA: Diagnosis not present

## 2024-03-14 DIAGNOSIS — M51362 Other intervertebral disc degeneration, lumbar region with discogenic back pain and lower extremity pain: Secondary | ICD-10-CM | POA: Diagnosis not present

## 2024-03-16 DIAGNOSIS — M51362 Other intervertebral disc degeneration, lumbar region with discogenic back pain and lower extremity pain: Secondary | ICD-10-CM | POA: Diagnosis not present

## 2024-03-17 DIAGNOSIS — M47816 Spondylosis without myelopathy or radiculopathy, lumbar region: Secondary | ICD-10-CM | POA: Diagnosis not present

## 2024-03-17 DIAGNOSIS — M5416 Radiculopathy, lumbar region: Secondary | ICD-10-CM | POA: Diagnosis not present

## 2024-03-24 DIAGNOSIS — M5416 Radiculopathy, lumbar region: Secondary | ICD-10-CM | POA: Diagnosis not present
